# Patient Record
Sex: Female | Born: 1995 | Race: White | Hispanic: No | Marital: Single | Smoking: Current some day smoker
Health system: Southern US, Community
[De-identification: ages and names within clinical notes are randomized; demographics above are authoritative.]

## PROBLEM LIST (undated history)

## (undated) DIAGNOSIS — F909 Attention-deficit hyperactivity disorder, unspecified type: Secondary | ICD-10-CM

## (undated) DIAGNOSIS — K8020 Calculus of gallbladder without cholecystitis without obstruction: Secondary | ICD-10-CM

## (undated) HISTORY — DX: Attention-deficit hyperactivity disorder, unspecified type: F90.9

## (undated) HISTORY — DX: Calculus of gallbladder without cholecystitis without obstruction: K80.20

---
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
## 2013-01-15 ENCOUNTER — Ambulatory Visit (INDEPENDENT_AMBULATORY_CARE_PROVIDER_SITE_OTHER): Payer: Medicaid Other | Admitting: Advanced Practice Midwife

## 2013-01-15 VITALS — BP 122/69 | Wt 140.0 lb

## 2013-01-15 DIAGNOSIS — O360990 Maternal care for other rhesus isoimmunization, unspecified trimester, not applicable or unspecified: Secondary | ICD-10-CM

## 2013-01-15 DIAGNOSIS — O360121 Maternal care for anti-D [Rh] antibodies, second trimester, fetus 1: Secondary | ICD-10-CM

## 2013-01-15 DIAGNOSIS — Z3480 Encounter for supervision of other normal pregnancy, unspecified trimester: Secondary | ICD-10-CM

## 2013-01-15 NOTE — Progress Notes (Signed)
p=70 

## 2013-01-15 NOTE — Progress Notes (Signed)
Doing well.  Good fetal movement, denies vaginal bleeding, LOF, regular contractions.  Reviewed normal quad screen results.

## 2013-01-27 ENCOUNTER — Telehealth: Payer: Self-pay | Admitting: *Deleted

## 2013-02-12 ENCOUNTER — Ambulatory Visit (INDEPENDENT_AMBULATORY_CARE_PROVIDER_SITE_OTHER): Payer: Medicaid Other | Admitting: Advanced Practice Midwife

## 2013-02-12 VITALS — BP 120/79 | Wt 143.0 lb

## 2013-02-12 DIAGNOSIS — O360121 Maternal care for anti-D [Rh] antibodies, second trimester, fetus 1: Secondary | ICD-10-CM

## 2013-02-12 DIAGNOSIS — O360990 Maternal care for other rhesus isoimmunization, unspecified trimester, not applicable or unspecified: Secondary | ICD-10-CM

## 2013-02-12 DIAGNOSIS — Z3480 Encounter for supervision of other normal pregnancy, unspecified trimester: Secondary | ICD-10-CM

## 2013-02-12 DIAGNOSIS — Z3492 Encounter for supervision of normal pregnancy, unspecified, second trimester: Secondary | ICD-10-CM

## 2013-02-12 DIAGNOSIS — Z3482 Encounter for supervision of other normal pregnancy, second trimester: Secondary | ICD-10-CM

## 2013-02-12 DIAGNOSIS — Z23 Encounter for immunization: Secondary | ICD-10-CM

## 2013-02-12 MED ORDER — RHO D IMMUNE GLOBULIN 1500 UNIT/2ML IJ SOLN: 300.0000 ug | Freq: Once | INTRAMUSCULAR | Status: DC

## 2013-02-12 MED ORDER — RHO D IMMUNE GLOBULIN 1500 UNIT/2ML IJ SOLN
300.0000 ug | Freq: Once | INTRAMUSCULAR | Status: AC
  Administered 2013-02-12: 300 ug via INTRAMUSCULAR

## 2013-02-12 MED ORDER — DTAP-HEPATITIS B RECOMB-IPV IM SUSP: 0.5000 mL | Freq: Once | INTRAMUSCULAR | Status: DC

## 2013-02-12 MED ORDER — TETANUS-DIPHTH-ACELL PERTUSSIS 5-2.5-18.5 LF-MCG/0.5 IM SUSP: 0.5000 mL | Freq: Once | INTRAMUSCULAR | Status: DC

## 2013-02-12 NOTE — Progress Notes (Signed)
Doing well.  Good fetal movement, denies vaginal bleeding, LOF, regular contractions.  Pt reports round ligament pain when walking. Recommend pregnancy support belt, warm bath, increased PO fluids.  Glucose screen/rhophylac done today.

## 2013-02-12 NOTE — Addendum Note (Signed)
Addended by: Arne Cleveland on: 02/12/2013 12:18 PM   Modules accepted: Orders

## 2013-02-12 NOTE — Addendum Note (Signed)
Addended by: Granville Lewis on: 02/12/2013 12:17 PM   Modules accepted: Orders

## 2013-02-12 NOTE — Progress Notes (Signed)
p-74  28 week labs

## 2013-02-13 LAB — CBC
HCT: 33.1 % — ABNORMAL LOW (ref 36.0–46.0)
MCH: 32.5 pg (ref 26.0–34.0)
MCHC: 35.3 g/dL (ref 30.0–36.0)
MCV: 91.9 fL (ref 78.0–100.0)
RDW: 12.5 % (ref 11.5–15.5)

## 2013-02-18 ENCOUNTER — Other Ambulatory Visit: Payer: Self-pay

## 2013-02-25 ENCOUNTER — Telehealth: Payer: Self-pay | Admitting: *Deleted

## 2013-02-25 NOTE — Telephone Encounter (Signed)
Pt called stating that her boyfriend has bronchitis and she is now feeling bad.  Coughing with mucous, 99 temp and diarrhea.  Suggested that her appt tomorrow for ROB be rescheduled until Monday, advised increasing fluid intake, Mucinex for congestion and Tylenol for fever.  If she get worse she will call office or go to [facility].

## 2013-03-01 ENCOUNTER — Ambulatory Visit (INDEPENDENT_AMBULATORY_CARE_PROVIDER_SITE_OTHER): Payer: Medicaid Other | Admitting: Advanced Practice Midwife

## 2013-03-01 VITALS — BP 130/79 | Temp 97.8°F | Wt 144.0 lb

## 2013-03-01 DIAGNOSIS — Z3492 Encounter for supervision of normal pregnancy, unspecified, second trimester: Secondary | ICD-10-CM

## 2013-03-01 DIAGNOSIS — Z3482 Encounter for supervision of other normal pregnancy, second trimester: Secondary | ICD-10-CM

## 2013-03-01 DIAGNOSIS — O2613 Low weight gain in pregnancy, third trimester: Secondary | ICD-10-CM

## 2013-03-01 DIAGNOSIS — O2610 Low weight gain in pregnancy, unspecified trimester: Secondary | ICD-10-CM | POA: Insufficient documentation

## 2013-03-01 DIAGNOSIS — O9989 Other specified diseases and conditions complicating pregnancy, childbirth and the puerperium: Secondary | ICD-10-CM

## 2013-03-01 DIAGNOSIS — Z3480 Encounter for supervision of other normal pregnancy, unspecified trimester: Secondary | ICD-10-CM

## 2013-03-01 LAB — POCT URINALYSIS DIPSTICK
Bilirubin, UA: 1
Blood, UA: NEGATIVE
Glucose, UA: NEGATIVE
Nitrite, UA: NEGATIVE
Spec Grav, UA: 1.01

## 2013-03-01 NOTE — Progress Notes (Signed)
P - 67 

## 2013-03-01 NOTE — Progress Notes (Signed)
Doing well.  Good fetal movement, denies vaginal bleeding, LOF, regular contractions.  Reports 20lb weight loss in first trimester, ~20lb gain since then, so currently at prepregnancy weight.  Infant breech on previous ultrasound, vertex by [facility] today.  F/U ultrasound at 34 weeks for growth/position.  Pt coughing today, reports URI x5 days.  Fever a few days ago which resolved.  Taking Mucinex, Benadryl and symptoms improved.  Recommend saline nasal spray.  Also Sudafed Ok in pregnancy if needed.

## 2013-03-15 ENCOUNTER — Ambulatory Visit (INDEPENDENT_AMBULATORY_CARE_PROVIDER_SITE_OTHER): Payer: Medicaid Other | Admitting: Advanced Practice Midwife

## 2013-03-15 VITALS — BP 125/67 | Wt 145.0 lb

## 2013-03-15 DIAGNOSIS — O2613 Low weight gain in pregnancy, third trimester: Secondary | ICD-10-CM

## 2013-03-15 DIAGNOSIS — O9989 Other specified diseases and conditions complicating pregnancy, childbirth and the puerperium: Secondary | ICD-10-CM

## 2013-03-15 DIAGNOSIS — Z3480 Encounter for supervision of other normal pregnancy, unspecified trimester: Secondary | ICD-10-CM

## 2013-03-15 DIAGNOSIS — Z3492 Encounter for supervision of normal pregnancy, unspecified, second trimester: Secondary | ICD-10-CM

## 2013-03-15 DIAGNOSIS — Z3493 Encounter for supervision of normal pregnancy, unspecified, third trimester: Secondary | ICD-10-CM

## 2013-03-15 NOTE — Progress Notes (Signed)
Growth [place] scheduled per previous note. Flu vaccine NV.

## 2013-03-15 NOTE — Patient Instructions (Signed)
Levonorgestrel intrauterine device (IUD) What is this medicine? LEVONORGESTREL IUD (LEE voe nor jes trel) is a contraceptive (birth control) device. The device is placed inside the uterus by a healthcare professional. It is used to prevent pregnancy and can also be used to treat heavy bleeding that occurs during your period. Depending on the device, it can be used for 3 to 5 years. This medicine may be used for other purposes; ask your health care provider or pharmacist if you have questions. COMMON BRAND NAME(S): Mirena, Skyla What should I tell my health care provider before I take this medicine? They need to know if you have any of these conditions: -abnormal Pap smear -cancer of the breast, uterus, or cervix -diabetes -endometritis -genital or pelvic infection now or in the past -have more than one sexual partner or your partner has more than one partner -heart disease -history of an ectopic or tubal pregnancy -immune system problems -IUD in place -liver disease or tumor -problems with blood clots or take blood-thinners -use intravenous drugs -uterus of unusual shape -vaginal bleeding that has not been explained -an unusual or allergic reaction to levonorgestrel, other hormones, silicone, or polyethylene, medicines, foods, dyes, or preservatives -pregnant or trying to get pregnant -breast-feeding How should I use this medicine? This device is placed inside the uterus by a health care professional. Talk to your pediatrician regarding the use of this medicine in children. Special care may be needed. Overdosage: If you think you have taken too much of this medicine contact a poison control center or emergency room at once. NOTE: This medicine is only for you. Do not share this medicine with others. What if I miss a dose? This does not apply. What may interact with this medicine? Do not take this medicine with any of the following  medications: -amprenavir -bosentan -fosamprenavir This medicine may also interact with the following medications: -aprepitant -barbiturate medicines for inducing sleep or treating seizures -bexarotene -griseofulvin -medicines to treat seizures like carbamazepine, ethotoin, felbamate, oxcarbazepine, phenytoin, topiramate -modafinil -pioglitazone -rifabutin -rifampin -rifapentine -some medicines to treat HIV infection like atazanavir, indinavir, lopinavir, nelfinavir, tipranavir, ritonavir -St. John's wort -warfarin This list may not describe all possible interactions. Give your health care provider a list of all the medicines, herbs, non-prescription drugs, or dietary supplements you use. Also tell them if you smoke, drink alcohol, or use illegal drugs. Some items may interact with your medicine. What should I watch for while using this medicine? Visit your doctor or health care professional for regular check ups. See your doctor if you or your partner has sexual contact with others, becomes HIV positive, or gets a sexual transmitted disease. This product does not protect you against HIV infection (AIDS) or other sexually transmitted diseases. You can check the placement of the IUD yourself by reaching up to the top of your vagina with clean fingers to feel the threads. Do not pull on the threads. It is a good habit to check placement after each menstrual period. Call your doctor right away if you feel more of the IUD than just the threads or if you cannot feel the threads at all. The IUD may come out by itself. You may become pregnant if the device comes out. If you notice that the IUD has come out use a backup birth control method like condoms and call your health care provider. Using tampons will not change the position of the IUD and are okay to use during your period. What side effects may I   notice from receiving this medicine? Side effects that you should report to your doctor or  health care professional as soon as possible: -allergic reactions like skin rash, itching or hives, swelling of the face, lips, or tongue -fever, flu-like symptoms -genital sores -high blood pressure -no menstrual period for 6 weeks during use -pain, swelling, warmth in the leg -pelvic pain or tenderness -severe or sudden headache -signs of pregnancy -stomach cramping -sudden shortness of breath -trouble with balance, talking, or walking -unusual vaginal bleeding, discharge -yellowing of the eyes or skin Side effects that usually do not require medical attention (report to your doctor or health care professional if they continue or are bothersome): -acne -breast pain -change in sex drive or performance -changes in weight -cramping, dizziness, or faintness while the device is being inserted -headache -irregular menstrual bleeding within first 3 to 6 months of use -nausea This list may not describe all possible side effects. Call your doctor for medical advice about side effects. You may report side effects to FDA at 1-800-FDA-1088. Where should I keep my medicine? This does not apply. NOTE: This sheet is a summary. It may not cover all possible information. If you have questions about this medicine, talk to your doctor, pharmacist, or health care provider.  2014, [employer]vier/[employer] Standard. (2011-05-02 13:54:04)  Deberah Pelton Contractions Pregnancy is commonly associated with contractions of the uterus throughout the pregnancy. Towards the end of pregnancy (32 to 34 weeks), these contractions [facility] Willa Rough) can develop more often and may become more forceful. This is not true labor because these contractions do not result in opening (dilatation) and thinning of the cervix. They are sometimes difficult to tell apart from true labor because these contractions can be forceful and people have different pain tolerances. You should not feel embarrassed if you go to the hospital with false  labor. Sometimes, the only way to tell if you are in true labor is for your caregiver to follow the changes in the cervix. How to tell the difference between true and false labor:  False labor.  The contractions of false labor are usually shorter, irregular and not as hard as those of true labor.  They are often felt in the front of the lower abdomen and in the groin.  They may leave with walking around or changing positions while lying down.  They get weaker and are shorter lasting as time goes on.  These contractions are usually irregular.  They do not usually become progressively stronger, regular and closer together as with true labor.  True labor.  Contractions in true labor last 30 to 70 seconds, become very regular, usually become more intense, and increase in frequency.  They do not go away with walking.  The discomfort is usually felt in the top of the uterus and spreads to the lower abdomen and low back.  True labor can be determined by your caregiver with an exam. This will show that the cervix is dilating and getting thinner. If there are no prenatal problems or other health problems associated with the pregnancy, it is completely safe to be sent home with false labor and await the onset of true labor. HOME CARE INSTRUCTIONS   Keep up with your usual exercises and instructions.  Take medications as directed.  Keep your regular prenatal appointment.  Eat and drink lightly if you think you are going into labor.  If BH contractions are making you uncomfortable:  Change your activity position from lying down or resting to walking/walking  to resting.  Sit and rest in a tub of warm water.  Drink 2 to 3 glasses of water. Dehydration may cause B-H contractions.  Do slow and deep breathing several times an hour. SEEK IMMEDIATE MEDICAL CARE IF:   Your contractions continue to become stronger, more regular, and closer together.  You have a gushing, burst or leaking  of fluid from the vagina.  An oral temperature above 102 F (38.9 C) develops.  You have passage of blood-tinged mucus.  You develop vaginal bleeding.  You develop continuous belly (abdominal) pain.  You have low back pain that you never had before.  You feel the baby's head pushing down causing pelvic pressure.  The baby is not moving as much as it used to. Document Released: 04/01/2005 Document Revised: 06/24/2011 Document Reviewed: 09/23/2008 [facility] Patient Information 2014 [place], [place].

## 2013-03-15 NOTE — Progress Notes (Signed)
p-79 

## 2013-03-22 ENCOUNTER — Ambulatory Visit (HOSPITAL_COMMUNITY)
Admission: RE | Admit: 2013-03-22 | Discharge: 2013-03-22 | Disposition: A | Discharge status: Home / Self Care | Payer: Medicaid Other | Source: Ambulatory Visit | Attending: Advanced Practice Midwife | Admitting: Advanced Practice Midwife

## 2013-03-22 DIAGNOSIS — Z3689 Encounter for other specified antenatal screening: Secondary | ICD-10-CM | POA: Insufficient documentation

## 2013-03-22 DIAGNOSIS — O2613 Low weight gain in pregnancy, third trimester: Secondary | ICD-10-CM

## 2013-03-22 DIAGNOSIS — Z3493 Encounter for supervision of normal pregnancy, unspecified, third trimester: Secondary | ICD-10-CM

## 2013-03-29 ENCOUNTER — Encounter: Payer: Self-pay | Admitting: Advanced Practice Midwife

## 2013-03-29 ENCOUNTER — Ambulatory Visit (INDEPENDENT_AMBULATORY_CARE_PROVIDER_SITE_OTHER): Payer: Medicaid Other | Admitting: Advanced Practice Midwife

## 2013-03-29 VITALS — BP 134/93 | Wt 149.0 lb

## 2013-03-29 DIAGNOSIS — Z23 Encounter for immunization: Secondary | ICD-10-CM

## 2013-03-29 DIAGNOSIS — Z3480 Encounter for supervision of other normal pregnancy, unspecified trimester: Secondary | ICD-10-CM

## 2013-03-29 MED ORDER — INFLUENZA VAC SPLIT QUAD 0.5 ML IM SUSP: 0.5000 mL | Freq: Once | INTRAMUSCULAR | Status: DC

## 2013-03-29 NOTE — Progress Notes (Signed)
[place] results reviewed. Growth within normal limits. Good fetal movement. Plan cultures next visit.

## 2013-03-29 NOTE — Progress Notes (Signed)
P = 87 

## 2013-03-29 NOTE — Patient Instructions (Signed)
Third Trimester of Pregnancy  The third trimester is from week 29 through week 42, months 7 through 9. The third trimester is a time when the fetus is growing rapidly. At the end of the ninth month, the fetus is about 20 inches in length and weighs 6 10 pounds.   BODY CHANGES  Your body goes through many changes during pregnancy. The changes vary from woman to woman.    Your weight will continue to increase. You can expect to gain 25 35 pounds (11 16 kg) by the end of the pregnancy.   You may begin to get stretch marks on your hips, abdomen, and breasts.   You may urinate more often because the fetus is moving lower into your pelvis and pressing on your bladder.   You may develop or continue to have heartburn as a result of your pregnancy.   You may develop constipation because certain hormones are causing the muscles that push waste through your intestines to slow down.   You may develop hemorrhoids or swollen, bulging veins (varicose veins).   You may have pelvic pain because of the weight gain and pregnancy hormones relaxing your joints between the bones in your pelvis. Back aches may result from over exertion of the muscles supporting your posture.   Your breasts will continue to grow and be tender. A yellow discharge may leak from your breasts called colostrum.   Your belly button may stick out.   You may feel short of breath because of your expanding uterus.   You may notice the fetus "dropping," or moving lower in your abdomen.   You may have a bloody mucus discharge. This usually occurs a few days to a week before labor begins.   Your cervix becomes thin and soft (effaced) near your due date.  WHAT TO EXPECT AT YOUR PRENATAL EXAMS   You will have prenatal exams every 2 weeks until week 36. Then, you will have weekly prenatal exams. During a routine prenatal visit:   You will be weighed to make sure you and the fetus are growing normally.   Your blood pressure is taken.   Your abdomen will be  measured to track your baby's growth.   The fetal heartbeat will be listened to.   Any test results from the previous visit will be discussed.   You may have a cervical check near your due date to see if you have effaced.  At around 36 weeks, your caregiver will check your cervix. At the same time, your caregiver will also perform a test on the secretions of the vaginal tissue. This test is to determine if a type of bacteria, Group B streptococcus, is present. Your caregiver will explain this further.  Your caregiver may ask you:   What your birth plan is.   How you are feeling.   If you are feeling the baby move.   If you have had any abnormal symptoms, such as leaking fluid, bleeding, severe headaches, or abdominal cramping.   If you have any questions.  Other tests or screenings that may be performed during your third trimester include:   Blood tests that check for low iron levels (anemia).   Fetal testing to check the health, activity level, and growth of the fetus. Testing is done if you have certain medical conditions or if there are problems during the pregnancy.  FALSE LABOR  You may feel small, irregular contractions that eventually go away. These are called Braxton Hicks contractions, or   false labor. Contractions may last for hours, days, or even weeks before true labor sets in. If contractions come at regular intervals, intensify, or become painful, it is best to be seen by your caregiver.   SIGNS OF LABOR    Menstrual-like cramps.   Contractions that are 5 minutes apart or less.   Contractions that start on the top of the uterus and spread down to the lower abdomen and back.   A sense of increased pelvic pressure or back pain.   A watery or bloody mucus discharge that comes from the vagina.  If you have any of these signs before the 37th week of pregnancy, call your caregiver right away. You need to go to the hospital to get checked immediately.  HOME CARE INSTRUCTIONS    Avoid all  smoking, herbs, alcohol, and unprescribed drugs. These chemicals affect the formation and growth of the baby.   Follow your caregiver's instructions regarding medicine use. There are medicines that are either safe or unsafe to take during pregnancy.   Exercise only as directed by your caregiver. Experiencing uterine cramps is a good sign to stop exercising.   Continue to eat regular, healthy meals.   Wear a good support bra for breast tenderness.   Do not use hot tubs, steam rooms, or saunas.   Wear your seat belt at all times when driving.   Avoid raw meat, uncooked cheese, cat litter boxes, and soil used by cats. These carry germs that can cause birth defects in the baby.   Take your prenatal vitamins.   Try taking a stool softener (if your caregiver approves) if you develop constipation. Eat more high-fiber foods, such as fresh vegetables or fruit and whole grains. Drink plenty of fluids to keep your urine clear or pale yellow.   Take warm sitz baths to soothe any pain or discomfort caused by hemorrhoids. Use hemorrhoid cream if your caregiver approves.   If you develop varicose veins, wear support hose. Elevate your feet for 15 minutes, 3 4 times a day. Limit salt in your diet.   Avoid heavy lifting, wear low heal shoes, and practice good posture.   Rest a lot with your legs elevated if you have leg cramps or low back pain.   Visit your dentist if you have not gone during your pregnancy. Use a soft toothbrush to brush your teeth and be gentle when you floss.   A sexual relationship may be continued unless your caregiver directs you otherwise.   Do not travel far distances unless it is absolutely necessary and only with the approval of your caregiver.   Take prenatal classes to understand, practice, and ask questions about the labor and delivery.   Make a trial run to the hospital.   Pack your hospital bag.   Prepare the baby's nursery.   Continue to go to all your prenatal visits as directed  by your caregiver.  SEEK MEDICAL CARE IF:   You are unsure if you are in labor or if your water has broken.   You have dizziness.   You have mild pelvic cramps, pelvic pressure, or nagging pain in your abdominal area.   You have persistent nausea, vomiting, or diarrhea.   You have a bad smelling vaginal discharge.   You have pain with urination.  SEEK IMMEDIATE MEDICAL CARE IF:    You have a fever.   You are leaking fluid from your vagina.   You have spotting or bleeding from your vagina.     You have severe abdominal cramping or pain.   You have rapid weight loss or gain.   You have shortness of breath with chest pain.   You notice sudden or extreme swelling of your face, hands, ankles, feet, or legs.   You have not felt your baby move in over an hour.   You have severe headaches that do not go away with medicine.   You have vision changes.  Document Released: 03/26/2001 Document Revised: 12/02/2012 Document Reviewed: 06/02/2012  [employer] Patient Information 2014 [employer].

## 2013-04-05 ENCOUNTER — Ambulatory Visit (INDEPENDENT_AMBULATORY_CARE_PROVIDER_SITE_OTHER): Payer: Medicaid Other | Admitting: Advanced Practice Midwife

## 2013-04-05 ENCOUNTER — Encounter: Payer: Self-pay | Admitting: Advanced Practice Midwife

## 2013-04-05 VITALS — BP 141/85 | Wt 146.0 lb

## 2013-04-05 DIAGNOSIS — Z3493 Encounter for supervision of normal pregnancy, unspecified, third trimester: Secondary | ICD-10-CM

## 2013-04-05 DIAGNOSIS — Z3483 Encounter for supervision of other normal pregnancy, third trimester: Secondary | ICD-10-CM

## 2013-04-05 DIAGNOSIS — Z3480 Encounter for supervision of other normal pregnancy, unspecified trimester: Secondary | ICD-10-CM

## 2013-04-05 DIAGNOSIS — O9989 Other specified diseases and conditions complicating pregnancy, childbirth and the puerperium: Secondary | ICD-10-CM

## 2013-04-05 DIAGNOSIS — O2613 Low weight gain in pregnancy, third trimester: Secondary | ICD-10-CM

## 2013-04-05 DIAGNOSIS — R030 Elevated blood-pressure reading, without diagnosis of hypertension: Secondary | ICD-10-CM

## 2013-04-05 LAB — OB RESULTS CONSOLE GC/CHLAMYDIA
CHLAMYDIA, DNA PROBE: NEGATIVE
GC PROBE AMP, GENITAL: NEGATIVE

## 2013-04-05 LAB — OB RESULTS CONSOLE GBS: STREP GROUP B AG: NEGATIVE

## 2013-04-05 NOTE — Progress Notes (Signed)
p-85 

## 2013-04-05 NOTE — Progress Notes (Signed)
Doing well.  Good fetal movement, denies vaginal bleeding, LOF, regular contractions.  Denies h/a, epigastric pain, visual disturbances.  Retake BP 130s/90s.  CBC, CMP, P/C ratio today.  Appt scheduled in 1 week.

## 2013-04-06 LAB — GC/CHLAMYDIA PROBE AMP
CT Probe RNA: NEGATIVE
GC Probe RNA: NEGATIVE

## 2013-04-06 LAB — COMPREHENSIVE METABOLIC PANEL
ALT: <8 U/L (ref 0–35)
Albumin: 3.6 g/dL (ref 3.5–5.2)
Alkaline Phosphatase: 151 U/L — ABNORMAL HIGH (ref 39–117)
Glucose, Bld: 96 mg/dL (ref 70–99)
Potassium: 3.7 mEq/L (ref 3.5–5.3)
Sodium: 137 mEq/L (ref 135–145)
Total Bilirubin: 0.4 mg/dL (ref 0.3–1.2)
Total Protein: 6.1 g/dL (ref 6.0–8.3)

## 2013-04-06 LAB — CBC
Hemoglobin: 12.7 g/dL (ref 12.0–15.0)
MCHC: 35 g/dL (ref 30.0–36.0)
Platelets: 229 10*3/uL (ref 150–400)
RBC: 4.09 MIL/uL (ref 3.87–5.11)

## 2013-04-06 LAB — PROTEIN / CREATININE RATIO, URINE: Total Protein, Urine: 12 mg/dL

## 2013-04-07 LAB — CULTURE, BETA STREP (GROUP B ONLY)

## 2013-04-12 ENCOUNTER — Encounter: Payer: Self-pay | Admitting: Advanced Practice Midwife

## 2013-04-12 ENCOUNTER — Ambulatory Visit (INDEPENDENT_AMBULATORY_CARE_PROVIDER_SITE_OTHER): Payer: Medicaid Other | Admitting: Advanced Practice Midwife

## 2013-04-12 VITALS — BP 122/80 | Wt 150.0 lb

## 2013-04-12 DIAGNOSIS — O360990 Maternal care for other rhesus isoimmunization, unspecified trimester, not applicable or unspecified: Secondary | ICD-10-CM

## 2013-04-12 DIAGNOSIS — Z3480 Encounter for supervision of other normal pregnancy, unspecified trimester: Secondary | ICD-10-CM

## 2013-04-12 DIAGNOSIS — Z6791 Unspecified blood type, Rh negative: Secondary | ICD-10-CM

## 2013-04-12 NOTE — Progress Notes (Signed)
p-73 

## 2013-04-12 NOTE — Progress Notes (Signed)
Doing well.  Good fetal movement, denies vaginal bleeding, LOF, regular contractions.   Some occasional uncomfortable contractions.  Cervical exam at pt request, cervix 1.5/50/-3, soft, midposition, vertex.  Reviewed signs of labor.

## 2013-04-15 NOTE — L&D Delivery Note (Signed)
Delivery Note At 5:08 AM a viable female was delivered via Vaginal, Spontaneous Delivery (Presentation: Right Occiput Anterior).  APGAR: 8, 9; weight .   Placenta status: Intact, Spontaneous.  Cord: 3 vessels with the following complications: None.    Anesthesia: Epidural  Episiotomy: None Lacerations: Labial Suture Repair: 4.0 monocryl on SH Est. Blood Loss (mL): 250  Mom to postpartum.  Baby to Couplet care / Skin to Skin. NSVD over itnact perinuem. Active managmetn of third stage of labor with pit and traction delivered intact placenta 3v cord. Labial tear repaired with 4.0 monocryl on SH. Hemostatic, EBL 250. Counts correct.  Jolyn LentODOM, Lorre Opdahl RYAN 04/27/2013, 5:25 AM

## 2013-04-18 ENCOUNTER — Inpatient Hospital Stay (HOSPITAL_COMMUNITY)
Admission: AD | Admit: 2013-04-18 | Discharge: 2013-04-18 | Disposition: A | Discharge status: Home / Self Care | Payer: Medicaid Other | Source: Ambulatory Visit | Attending: Obstetrics & Gynecology | Admitting: Obstetrics & Gynecology

## 2013-04-18 ENCOUNTER — Encounter (HOSPITAL_COMMUNITY): Payer: Self-pay

## 2013-04-18 DIAGNOSIS — O479 False labor, unspecified: Secondary | ICD-10-CM | POA: Insufficient documentation

## 2013-04-18 NOTE — Discharge Instructions (Signed)
Third Trimester of Pregnancy  The third trimester is from week 29 through week 42, months 7 through 9. The third trimester is a time when the fetus is growing rapidly. At the end of the ninth month, the fetus is about 20 inches in length and weighs 6 10 pounds.   BODY CHANGES  Your body goes through many changes during pregnancy. The changes vary from woman to woman.    Your weight will continue to increase. You can expect to gain 25 35 pounds (11 16 kg) by the end of the pregnancy.   You may begin to get stretch marks on your hips, abdomen, and breasts.   You may urinate more often because the fetus is moving lower into your pelvis and pressing on your bladder.   You may develop or continue to have heartburn as a result of your pregnancy.   You may develop constipation because certain hormones are causing the muscles that push waste through your intestines to slow down.   You may develop hemorrhoids or swollen, bulging veins (varicose veins).   You may have pelvic pain because of the weight gain and pregnancy hormones relaxing your joints between the bones in your pelvis. Back aches may result from over exertion of the muscles supporting your posture.   Your breasts will continue to grow and be tender. A yellow discharge may leak from your breasts called colostrum.   Your belly button may stick out.   You may feel short of breath because of your expanding uterus.   You may notice the fetus "dropping," or moving lower in your abdomen.   You may have a bloody mucus discharge. This usually occurs a few days to a week before labor begins.   Your cervix becomes thin and soft (effaced) near your due date.  WHAT TO EXPECT AT YOUR PRENATAL EXAMS   You will have prenatal exams every 2 weeks until week 36. Then, you will have weekly prenatal exams. During a routine prenatal visit:   You will be weighed to make sure you and the fetus are growing normally.   Your blood pressure is taken.   Your abdomen will be  measured to track your baby's growth.   The fetal heartbeat will be listened to.   Any test results from the previous visit will be discussed.   You may have a cervical check near your due date to see if you have effaced.  At around 36 weeks, your caregiver will check your cervix. At the same time, your caregiver will also perform a test on the secretions of the vaginal tissue. This test is to determine if a type of bacteria, Group B streptococcus, is present. Your caregiver will explain this further.  Your caregiver may ask you:   What your birth plan is.   How you are feeling.   If you are feeling the baby move.   If you have had any abnormal symptoms, such as leaking fluid, bleeding, severe headaches, or abdominal cramping.   If you have any questions.  Other tests or screenings that may be performed during your third trimester include:   Blood tests that check for low iron levels (anemia).   Fetal testing to check the health, activity level, and growth of the fetus. Testing is done if you have certain medical conditions or if there are problems during the pregnancy.  FALSE LABOR  You may feel small, irregular contractions that eventually go away. These are called Braxton Hicks contractions, or   false labor. Contractions may last for hours, days, or even weeks before true labor sets in. If contractions come at regular intervals, intensify, or become painful, it is best to be seen by your caregiver.   SIGNS OF LABOR    Menstrual-like cramps.   Contractions that are 5 minutes apart or less.   Contractions that start on the top of the uterus and spread down to the lower abdomen and back.   A sense of increased pelvic pressure or back pain.   A watery or bloody mucus discharge that comes from the vagina.  If you have any of these signs before the 37th week of pregnancy, call your caregiver right away. You need to go to the hospital to get checked immediately.  HOME CARE INSTRUCTIONS    Avoid all  smoking, herbs, alcohol, and unprescribed drugs. These chemicals affect the formation and growth of the baby.   Follow your caregiver's instructions regarding medicine use. There are medicines that are either safe or unsafe to take during pregnancy.   Exercise only as directed by your caregiver. Experiencing uterine cramps is a good sign to stop exercising.   Continue to eat regular, healthy meals.   Wear a good support bra for breast tenderness.   Do not use hot tubs, steam rooms, or saunas.   Wear your seat belt at all times when driving.   Avoid raw meat, uncooked cheese, cat litter boxes, and soil used by cats. These carry germs that can cause birth defects in the baby.   Take your prenatal vitamins.   Try taking a stool softener (if your caregiver approves) if you develop constipation. Eat more high-fiber foods, such as fresh vegetables or fruit and whole grains. Drink plenty of fluids to keep your urine clear or pale yellow.   Take warm sitz baths to soothe any pain or discomfort caused by hemorrhoids. Use hemorrhoid cream if your caregiver approves.   If you develop varicose veins, wear support hose. Elevate your feet for 15 minutes, 3 4 times a day. Limit salt in your diet.   Avoid heavy lifting, wear low heal shoes, and practice good posture.   Rest a lot with your legs elevated if you have leg cramps or low back pain.   Visit your dentist if you have not gone during your pregnancy. Use a soft toothbrush to brush your teeth and be gentle when you floss.   A sexual relationship may be continued unless your caregiver directs you otherwise.   Do not travel far distances unless it is absolutely necessary and only with the approval of your caregiver.   Take prenatal classes to understand, practice, and ask questions about the labor and delivery.   Make a trial run to the hospital.   Pack your hospital bag.   Prepare the baby's nursery.   Continue to go to all your prenatal visits as directed  by your caregiver.  SEEK MEDICAL CARE IF:   You are unsure if you are in labor or if your water has broken.   You have dizziness.   You have mild pelvic cramps, pelvic pressure, or nagging pain in your abdominal area.   You have persistent nausea, vomiting, or diarrhea.   You have a bad smelling vaginal discharge.   You have pain with urination.  SEEK IMMEDIATE MEDICAL CARE IF:    You have a fever.   You are leaking fluid from your vagina.   You have spotting or bleeding from your vagina.     You have severe abdominal cramping or pain.   You have rapid weight loss or gain.   You have shortness of breath with chest pain.   You notice sudden or extreme swelling of your face, hands, ankles, feet, or legs.   You have not felt your baby move in over an hour.   You have severe headaches that do not go away with medicine.   You have vision changes.  Document Released: 03/26/2001 Document Revised: 12/02/2012 Document Reviewed: 06/02/2012  [employer] Patient Information 2014 [employer].

## 2013-04-18 NOTE — MAU Note (Signed)
Pt presents complaining of contractions every 4-5 minutes apart. Denies vaginal bleeding or loss of fluid.

## 2013-04-21 ENCOUNTER — Encounter: Payer: Self-pay | Admitting: Obstetrics & Gynecology

## 2013-04-21 ENCOUNTER — Encounter (HOSPITAL_COMMUNITY): Payer: Self-pay | Admitting: *Deleted

## 2013-04-21 ENCOUNTER — Inpatient Hospital Stay (HOSPITAL_COMMUNITY)
Admission: AD | Admit: 2013-04-21 | Discharge: 2013-04-21 | DRG: 780 | Disposition: A | Discharge status: Home / Self Care | Payer: Medicaid Other | Source: Ambulatory Visit | Attending: Obstetrics and Gynecology | Admitting: Obstetrics and Gynecology

## 2013-04-21 ENCOUNTER — Ambulatory Visit (INDEPENDENT_AMBULATORY_CARE_PROVIDER_SITE_OTHER): Payer: Medicaid Other | Admitting: Obstetrics & Gynecology

## 2013-04-21 VITALS — BP 132/93 | Wt 150.0 lb

## 2013-04-21 DIAGNOSIS — Z6791 Unspecified blood type, Rh negative: Secondary | ICD-10-CM

## 2013-04-21 DIAGNOSIS — O479 False labor, unspecified: Secondary | ICD-10-CM

## 2013-04-21 DIAGNOSIS — Z3492 Encounter for supervision of normal pregnancy, unspecified, second trimester: Secondary | ICD-10-CM

## 2013-04-21 DIAGNOSIS — O99330 Smoking (tobacco) complicating pregnancy, unspecified trimester: Secondary | ICD-10-CM

## 2013-04-21 DIAGNOSIS — IMO0001 Reserved for inherently not codable concepts without codable children: Secondary | ICD-10-CM

## 2013-04-21 DIAGNOSIS — O26899 Other specified pregnancy related conditions, unspecified trimester: Secondary | ICD-10-CM

## 2013-04-21 DIAGNOSIS — F17200 Nicotine dependence, unspecified, uncomplicated: Secondary | ICD-10-CM

## 2013-04-21 DIAGNOSIS — Z3480 Encounter for supervision of other normal pregnancy, unspecified trimester: Secondary | ICD-10-CM

## 2013-04-21 LAB — CBC
HCT: 33.5 % — ABNORMAL LOW (ref 36.0–46.0)
Hemoglobin: 11.9 g/dL — ABNORMAL LOW (ref 12.0–15.0)
MCH: 29.9 pg (ref 26.0–34.0)
MCHC: 35.5 g/dL (ref 30.0–36.0)
MCV: 84.2 fL (ref 78.0–100.0)
Platelets: 192 K/uL (ref 150–400)
RBC: 3.98 MIL/uL (ref 3.87–5.11)
RDW: 12.3 % (ref 11.5–15.5)
WBC: 10.8 K/uL — ABNORMAL HIGH (ref 4.0–10.5)

## 2013-04-21 LAB — RPR: RPR Ser Ql: NONREACTIVE

## 2013-04-21 MED ORDER — CITRIC ACID-SODIUM CITRATE 334-500 MG/5ML PO SOLN: 30.0000 mL | ORAL | Status: DC | PRN

## 2013-04-21 MED ORDER — ONDANSETRON HCL 4 MG/2ML IJ SOLN: 4.0000 mg | Freq: Four times a day (QID) | INTRAMUSCULAR | Status: DC | PRN

## 2013-04-21 MED ORDER — PHENYLEPHRINE 40 MCG/ML (10ML) SYRINGE FOR IV PUSH (FOR BLOOD PRESSURE SUPPORT): 80.0000 ug | PREFILLED_SYRINGE | INTRAVENOUS | Status: DC | PRN

## 2013-04-21 MED ORDER — LACTATED RINGERS IV SOLN: 500.0000 mL | Freq: Once | INTRAVENOUS | Status: DC

## 2013-04-21 MED ORDER — IBUPROFEN 600 MG PO TABS: 600.0000 mg | ORAL_TABLET | Freq: Four times a day (QID) | ORAL | Status: DC | PRN

## 2013-04-21 MED ORDER — OXYTOCIN BOLUS FROM INFUSION: 500.0000 mL | INTRAVENOUS | Status: DC

## 2013-04-21 MED ORDER — ACETAMINOPHEN 325 MG PO TABS: 650.0000 mg | ORAL_TABLET | ORAL | Status: DC | PRN

## 2013-04-21 MED ORDER — FENTANYL 2.5 MCG/ML BUPIVACAINE 1/10 % EPIDURAL INFUSION (WH - ANES): 14.0000 mL/h | INTRAMUSCULAR | Status: DC | PRN

## 2013-04-21 MED ORDER — LIDOCAINE HCL (PF) 1 % IJ SOLN: 30.0000 mL | INTRAMUSCULAR | Status: DC | PRN

## 2013-04-21 MED ORDER — EPHEDRINE 5 MG/ML INJ: 10.0000 mg | INTRAVENOUS | Status: DC | PRN

## 2013-04-21 MED ORDER — ZOLPIDEM TARTRATE 5 MG PO TABS: 5.0000 mg | ORAL_TABLET | Freq: Every evening | ORAL | Status: DC | PRN

## 2013-04-21 MED ORDER — DIPHENHYDRAMINE HCL 50 MG/ML IJ SOLN: 12.5000 mg | INTRAMUSCULAR | Status: DC | PRN

## 2013-04-21 MED ORDER — OXYCODONE-ACETAMINOPHEN 5-325 MG PO TABS: 1.0000 | ORAL_TABLET | ORAL | Status: DC | PRN

## 2013-04-21 MED ORDER — LACTATED RINGERS IV SOLN: 500.0000 mL | INTRAVENOUS | Status: DC | PRN

## 2013-04-21 MED ORDER — OXYTOCIN 40 UNITS IN LACTATED RINGERS INFUSION - SIMPLE MED: 62.5000 mL/h | INTRAVENOUS | Status: DC

## 2013-04-21 MED ORDER — LACTATED RINGERS IV SOLN
INTRAVENOUS | Status: DC
  Administered 2013-04-21: 15:00:00 via INTRAVENOUS

## 2013-04-21 NOTE — Progress Notes (Signed)
[id number] 

## 2013-04-21 NOTE — MAU Note (Signed)
Patient states she was seen in the office today and was 4 cm and sent to MAU for evaluation. States she is having frequent contractions every 3-5 minutes. Has bloody show from exam, no leaking. Reports good fetal movement.

## 2013-04-21 NOTE — Discharge Summary (Signed)
Obstetric Discharge Summary Reason for Admission: observation/evaluation Pt was admitted from OV for cervical dilation to 4 cm and contractions every 5-10 minutes for further evaluation for possible onset of labor.  However, pt was checked 4 hours later and was 4-5 cm dilated, and checked again 4 hours after this still at 4 cm.  Her contractions remained very similar at every 5-10 minutes, not increasing in amplitude.  The situation was explained to the pt and her family that this could continue for several days and at this point with failure for actually true onset of labor, that return to home and follow up appointments with her OB was a better option.  Pt and her family understood this and were agreeable to the plan.   Hemoglobin  Date Value Range Status  04/21/2013 11.9* 12.0 - 15.0 g/dL Final     HCT  Date Value Range Status  04/21/2013 33.5* 36.0 - 46.0 % Final    Physical Exam:  General: alert, cooperative and appears stated age Heart: RRR, no murmurs appreciated   Dilation: 3.5 Effacement (%): 70 Cervical Position: Middle Station: -2 Presentation: Vertex Exam by:: L.Mears,Rn   Discharge Diagnoses: Failure of onset of labor  Discharge Information: Date: 04/21/2013 Activity: unrestricted Diet: routine Medications: None Condition: stable Instructions: refer to practice specific booklet Discharge to: home   Gildardo CrankerHess, Bryan 04/21/2013, 10:01 PM  I have participated in the care of this patient and I agree with the above. Cam HaiSHAW, Arafat Cocuzza 10:26 PM 04/21/2013

## 2013-04-21 NOTE — Progress Notes (Signed)
Pt up to ambulate in hallway.

## 2013-04-21 NOTE — Progress Notes (Signed)
Routine visit.  Good FM. Some contractions. Labor precautions reviewed. Membranes swept. DTRs-1+ and equal, No edema. She denies HA, visual changes, or RUQ pain.

## 2013-04-21 NOTE — Discharge Instructions (Signed)
Augmentation of Labor Augmentation of labor is when labor is stimulated with oxytocin medication. Oxytocin stimulates and strengthens uterine contractions. The medicine can be given if the uterine contractions slow down, stop or delay the progress of labor and delivery of the baby. It may be given through a vein (intravenously), in small or high doses using an electronic monitor. The monitor controls the dose every 20 minutes until the uterine contractions last long enough and are strong enough to help the labor and delivery progress without complications to the mother and baby.  Another way to augment labor is to break the bag of water (amniotic sac) that the baby is in. The mother and baby's conditions, the baby's size and position, and the size of the birth canal should be evaluated before beginning augmentation of the labor. LABOR SHOULD NOT BE AUGMENTED IF:  The baby is too big for the birth canal. This can be confirmed by ultrasound.  The umbilical cord drops in front of the baby's head or breech part (prolapse cord).  A previous Cesarean section was done with a vertical incision or you do not know what kind of incision was made in the uterus.  High dose oxytocin should not be used if there was a Cesarean section of any kind.  There was surgery on or into the uterus.  Herpes is present.  Cervical cancer is present.  The baby is lying sideways.  The pelvis is deformed.  You are pregnant with more than twins. AUGMENTATION CAN BE DONE WITH CLOSE MONITORING OF THE BABY AND MOTHER IF:  You have had 1 Cesarean section (as long as the uterine incision was transverse).  You have a breech baby, buttocks is first instead of the head.  You have twins.  You have heart, lung, or kidney disease.  You have too much fluid in the amniotic sac.  The baby's head is not engaged in the pelvis.  You have very high blood pressure. RISKS AND COMPLICATIONS   Over stimulation of the uterine  contractions (continuous, prolonged, very strong contractions) causing fetal distress.  Increase infection to the mother and baby.  Water intoxication of the mother if the proper intravenous solution is not used.  Uterine tearing (rupture).  Abruption of the placenta.  There is an increase in Cesarean section, forceps, and vacuum deliveries. THE BENEFITS OF AUGMENTATION OF LABOR:  Improves the contractions for a shorter labor and delivery.  Less use of medications and worry to the mother. When augmentation of labor is necessary the benefits should outweigh the risk. Document Released: 09/24/2006 Document Revised: 03/18/2012 Document Reviewed: 10/29/2012 [facility] Patient Information 2014 [place], [place].

## 2013-04-21 NOTE — Progress Notes (Signed)
NST reactive.

## 2013-04-21 NOTE — H&P (Signed)
Michele Perkins is a 18 y.o. female presenting for labor . Office Note: Patient states she was seen in the office today and was 4 cm and sent to MAU for evaluation. States she is having frequent contractions every 3-5 minutes. Has bloody show from exam, no leaking. Reports good fetal movement.        Maternal Medical History:  Reason for admission: Nausea.    OB History   Grav Para Term Preterm Abortions TAB SAB Ect Mult Living   1              Past Medical History  Diagnosis Date  . Gallbladder colic    History reviewed. No pertinent past surgical history. Family History: family history includes Heart attack in her maternal grandfather and maternal grandfather. Social History:  reports that she has been smoking Cigarettes.  She has a 1.75 pack-year smoking history. She has never used smokeless tobacco. She reports that she uses illicit drugs. She reports that she does not drink alcohol.   Prenatal Transfer Tool  Maternal Diabetes: No Genetic Screening: Normal Maternal Ultrasounds/Referrals: Normal Fetal Ultrasounds or other Referrals:  None Maternal Substance Abuse:  Yes:  Type: Smoker Significant Maternal Medications:  None Significant Maternal Lab Results:  None Other Comments:  None  Review of Systems  Constitutional: Negative for fever, chills and malaise/fatigue.  Gastrointestinal: Positive for abdominal pain. Negative for nausea, vomiting, diarrhea and constipation.  Genitourinary: Negative for dysuria.  Neurological: Negative for dizziness and headaches.    Dilation: 5 Effacement (%): 80 Station: -2;-1 Exam by:: CRoxan Hockey RN Blood pressure 137/79, pulse 58, temperature 98 F (36.7 C), temperature source Oral, resp. rate 20, height 5\' 2"  (1.575 m), weight 68.04 kg (150 lb), last menstrual period 08/20/2012, SpO2 100.00%. Maternal Exam:  Uterine Assessment: Contraction strength is moderate.  Contraction frequency is irregular.   Abdomen: Fundal height is  38.   Estimated fetal weight is 7.2.   Fetal presentation: vertex  Introitus: Normal vulva. Vagina is positive for vaginal discharge (bloody show).  Ferning test: not done.  Nitrazine test: not done. Amniotic fluid character: not assessed.  Pelvis: adequate for delivery.   Cervix: Cervix evaluated by digital exam.     Fetal Exam Fetal Monitor Review: Mode: ultrasound.   Baseline rate: 140.  Variability: moderate (6-25 bpm).   Pattern: accelerations present and no decelerations.    Fetal State Assessment: Category I - tracings are normal.     Physical Exam  Constitutional: She is oriented to person, place, and time. She appears well-developed and well-nourished. No distress.  HENT:  Head: Normocephalic.  Cardiovascular: Normal rate.   Respiratory: Effort normal.  GI: Soft. She exhibits no distension. There is no tenderness. There is no rebound and no guarding.  Genitourinary: Uterus normal. Vaginal discharge (bloody show) found.  Musculoskeletal: Normal range of motion.  Neurological: She is alert and oriented to person, place, and time.  Skin: Skin is warm and dry.  Psychiatric: She has a normal mood and affect.    Dilation: 5 Effacement (%): 80 Cervical Position: Middle Station: -2;-1 Presentation: Vertex Exam by:: Elonda Husky RN  Prenatal labs: ABO, Rh: A/NEG/-- (08/04 1231) Antibody: NEG (10/31 1146) Rubella: 1.08 (08/04 1231) RPR: NON REAC (08/04 1231)  HBsAg: NEGATIVE (08/04 1231)  HIV: NON REACTIVE (10/31 1032)  GBS:   Neg  Assessment/Plan: A:  SIUP at [redacted]w[redacted]d       Active Labor  P:  Admit to [facility]  Routine orders      Desires epidural later   [facility] IncWILLIAMS,Adarryl Goldammer 04/21/2013, 4:00 PM

## 2013-04-22 NOTE — Discharge Summary (Signed)
`````  Attestation of Attending Supervision of Advanced Practitioner: Evaluation and management procedures were performed by the PA/NP/CNM/OB Fellow under my supervision/collaboration. Chart reviewed and agree with management and plan.  Tj Kitchings V 04/22/2013 9:51 AM

## 2013-04-26 ENCOUNTER — Inpatient Hospital Stay (HOSPITAL_COMMUNITY): Payer: Medicaid Other | Admitting: Anesthesiology

## 2013-04-26 ENCOUNTER — Encounter (HOSPITAL_COMMUNITY): Payer: Medicaid Other | Admitting: Anesthesiology

## 2013-04-26 ENCOUNTER — Encounter (HOSPITAL_COMMUNITY): Payer: Self-pay | Admitting: *Deleted

## 2013-04-26 ENCOUNTER — Inpatient Hospital Stay (HOSPITAL_COMMUNITY)
Admission: AD | Admit: 2013-04-26 | Discharge: 2013-04-28 | DRG: 775 | Disposition: A | Discharge status: Home / Self Care | Payer: Medicaid Other | Source: Ambulatory Visit | Attending: Family Medicine | Admitting: Family Medicine

## 2013-04-26 DIAGNOSIS — O360990 Maternal care for other rhesus isoimmunization, unspecified trimester, not applicable or unspecified: Secondary | ICD-10-CM | POA: Diagnosis present

## 2013-04-26 DIAGNOSIS — Z3490 Encounter for supervision of normal pregnancy, unspecified, unspecified trimester: Secondary | ICD-10-CM

## 2013-04-26 DIAGNOSIS — Z3492 Encounter for supervision of normal pregnancy, unspecified, second trimester: Secondary | ICD-10-CM

## 2013-04-26 DIAGNOSIS — IMO0001 Reserved for inherently not codable concepts without codable children: Secondary | ICD-10-CM

## 2013-04-26 DIAGNOSIS — O26899 Other specified pregnancy related conditions, unspecified trimester: Secondary | ICD-10-CM

## 2013-04-26 DIAGNOSIS — O4290 Premature rupture of membranes, unspecified as to length of time between rupture and onset of labor, unspecified weeks of gestation: Secondary | ICD-10-CM | POA: Diagnosis present

## 2013-04-26 DIAGNOSIS — F17200 Nicotine dependence, unspecified, uncomplicated: Secondary | ICD-10-CM

## 2013-04-26 DIAGNOSIS — Z6791 Unspecified blood type, Rh negative: Secondary | ICD-10-CM

## 2013-04-26 LAB — CBC
HCT: 34.1 % — ABNORMAL LOW (ref 36.0–46.0)
HEMOGLOBIN: 12.1 g/dL (ref 12.0–15.0)
MCH: 30.2 pg (ref 26.0–34.0)
MCHC: 35.5 g/dL (ref 30.0–36.0)
MCV: 85 fL (ref 78.0–100.0)
Platelets: 218 10*3/uL (ref 150–400)
RBC: 4.01 MIL/uL (ref 3.87–5.11)
RDW: 12.5 % (ref 11.5–15.5)
WBC: 12.5 10*3/uL — ABNORMAL HIGH (ref 4.0–10.5)

## 2013-04-26 LAB — POCT FERN TEST: POCT Fern Test: POSITIVE

## 2013-04-26 LAB — RPR: RPR: NONREACTIVE

## 2013-04-26 MED ORDER — LACTATED RINGERS IV SOLN
500.0000 mL | Freq: Once | INTRAVENOUS | Status: AC
  Administered 2013-04-26: 500 mL via INTRAVENOUS

## 2013-04-26 MED ORDER — PHENYLEPHRINE 40 MCG/ML (10ML) SYRINGE FOR IV PUSH (FOR BLOOD PRESSURE SUPPORT)
80.0000 ug | PREFILLED_SYRINGE | INTRAVENOUS | Status: DC | PRN
  Filled 2013-04-26: qty 10
  Filled 2013-04-26: qty 2

## 2013-04-26 MED ORDER — OXYCODONE-ACETAMINOPHEN 5-325 MG PO TABS: 1.0000 | ORAL_TABLET | ORAL | Status: DC | PRN

## 2013-04-26 MED ORDER — LIDOCAINE HCL (PF) 1 % IJ SOLN
30.0000 mL | INTRAMUSCULAR | Status: DC | PRN
  Filled 2013-04-26 (×2): qty 30

## 2013-04-26 MED ORDER — CITRIC ACID-SODIUM CITRATE 334-500 MG/5ML PO SOLN: 30.0000 mL | ORAL | Status: DC | PRN

## 2013-04-26 MED ORDER — OXYTOCIN 40 UNITS IN LACTATED RINGERS INFUSION - SIMPLE MED
1.0000 m[IU]/min | INTRAVENOUS | Status: DC
  Administered 2013-04-26: 2 m[IU]/min via INTRAVENOUS
  Administered 2013-04-26: 10 m[IU]/min via INTRAVENOUS
  Administered 2013-04-26: 12 m[IU]/min via INTRAVENOUS

## 2013-04-26 MED ORDER — OXYTOCIN 40 UNITS IN LACTATED RINGERS INFUSION - SIMPLE MED
62.5000 mL/h | INTRAVENOUS | Status: DC
  Administered 2013-04-27: 62.5 mL/h via INTRAVENOUS
  Filled 2013-04-26: qty 1000

## 2013-04-26 MED ORDER — IBUPROFEN 600 MG PO TABS
600.0000 mg | ORAL_TABLET | Freq: Four times a day (QID) | ORAL | Status: DC | PRN
  Administered 2013-04-27: 600 mg via ORAL
  Filled 2013-04-26: qty 1

## 2013-04-26 MED ORDER — ONDANSETRON HCL 4 MG/2ML IJ SOLN
4.0000 mg | Freq: Four times a day (QID) | INTRAMUSCULAR | Status: DC | PRN
  Administered 2013-04-26: 4 mg via INTRAVENOUS
  Filled 2013-04-26: qty 2

## 2013-04-26 MED ORDER — EPHEDRINE 5 MG/ML INJ
10.0000 mg | INTRAVENOUS | Status: DC | PRN
  Filled 2013-04-26: qty 2

## 2013-04-26 MED ORDER — LACTATED RINGERS IV SOLN: 500.0000 mL | INTRAVENOUS | Status: DC | PRN

## 2013-04-26 MED ORDER — FENTANYL 2.5 MCG/ML BUPIVACAINE 1/10 % EPIDURAL INFUSION (WH - ANES)
14.0000 mL/h | INTRAMUSCULAR | Status: DC | PRN
Start: 2013-04-26 — End: 2013-04-27
  Administered 2013-04-26: 14 mL/h via EPIDURAL
  Filled 2013-04-26 (×2): qty 125

## 2013-04-26 MED ORDER — DIPHENHYDRAMINE HCL 50 MG/ML IJ SOLN: 12.5000 mg | INTRAMUSCULAR | Status: DC | PRN

## 2013-04-26 MED ORDER — ACETAMINOPHEN 325 MG PO TABS: 650.0000 mg | ORAL_TABLET | ORAL | Status: DC | PRN

## 2013-04-26 MED ORDER — LACTATED RINGERS IV SOLN
INTRAVENOUS | Status: DC
  Administered 2013-04-26 (×3): via INTRAVENOUS

## 2013-04-26 MED ORDER — FLEET ENEMA 7-19 GM/118ML RE ENEM: 1.0000 | ENEMA | RECTAL | Status: DC | PRN

## 2013-04-26 MED ORDER — PHENYLEPHRINE 40 MCG/ML (10ML) SYRINGE FOR IV PUSH (FOR BLOOD PRESSURE SUPPORT)
80.0000 ug | PREFILLED_SYRINGE | INTRAVENOUS | Status: DC | PRN
  Filled 2013-04-26: qty 2

## 2013-04-26 MED ORDER — OXYTOCIN BOLUS FROM INFUSION
500.0000 mL | INTRAVENOUS | Status: DC
  Administered 2013-04-27: 500 mL via INTRAVENOUS

## 2013-04-26 MED ORDER — LIDOCAINE HCL (PF) 1 % IJ SOLN
INTRAMUSCULAR | Status: DC | PRN
  Administered 2013-04-26 (×4): 4 mL

## 2013-04-26 MED ORDER — EPHEDRINE 5 MG/ML INJ
10.0000 mg | INTRAVENOUS | Status: DC | PRN
  Filled 2013-04-26: qty 2
  Filled 2013-04-26: qty 4

## 2013-04-26 MED ORDER — TERBUTALINE SULFATE 1 MG/ML IJ SOLN: 0.2500 mg | Freq: Once | INTRAMUSCULAR | Status: AC | PRN

## 2013-04-26 NOTE — Anesthesia Procedure Notes (Signed)
Epidural Patient location during procedure: OB Start time: 04/26/2013 9:14 PM  Staffing Performed by: anesthesiologist   Preanesthetic Checklist Completed: patient identified, site marked, surgical consent, pre-op evaluation, timeout performed, IV checked, risks and benefits discussed and monitors and equipment checked  Epidural Patient position: sitting Prep: site prepped and draped and DuraPrep Patient monitoring: continuous pulse ox and blood pressure Approach: midline Injection technique: LOR air  Needle:  Needle type: Tuohy  Needle gauge: 17 G Needle length: 9 cm and 9 Needle insertion depth: 4.5 cm Catheter type: closed end flexible Catheter size: 19 Gauge Catheter at skin depth: 9.5 cm Test dose: negative  Assessment Events: blood not aspirated, injection not painful, no injection resistance, negative IV test and no paresthesia  Additional Notes Discussed risk of headache, infection, bleeding, nerve injury and failed or incomplete block.  Patient voices understanding and wishes to proceed.  Epidural placed easily on first attempt. No paresthesia. Patient tolerated procedure well with no apparent complications.  Jasmine DecemberA> Demetrios Byron, MDReason for block:procedure for pain

## 2013-04-26 NOTE — Anesthesia Preprocedure Evaluation (Signed)
Anesthesia Evaluation  Patient identified by MRN, date of birth, ID band Patient awake    Reviewed: Allergy & Precautions, H&P , NPO status , Patient's Chart, lab work & pertinent test results, reviewed documented beta blocker date and time   History of Anesthesia Complications Negative for: history of anesthetic complications  Airway Mallampati: I TM Distance: >3 FB Neck ROM: full    Dental  (+) Teeth Intact   Pulmonary neg pulmonary ROS, former smoker,  breath sounds clear to auscultation        Cardiovascular negative cardio ROS  Rhythm:regular Rate:Normal     Neuro/Psych negative neurological ROS  negative psych ROS   GI/Hepatic negative GI ROS, Neg liver ROS, (+)       marijuana use,   Endo/Other  negative endocrine ROS  Renal/GU negative Renal ROS     Musculoskeletal   Abdominal   Peds  Hematology negative hematology ROS (+)   Anesthesia Other Findings   Reproductive/Obstetrics (+) Pregnancy                           Anesthesia Physical Anesthesia Plan  ASA: II  Anesthesia Plan: Epidural   Post-op Pain Management:    Induction:   Airway Management Planned:   Additional Equipment:   Intra-op Plan:   Post-operative Plan:   Informed Consent: I have reviewed the patients History and Physical, chart, labs and discussed the procedure including the risks, benefits and alternatives for the proposed anesthesia with the patient or authorized representative who has indicated his/her understanding and acceptance.     Plan Discussed with:   Anesthesia Plan Comments:         Anesthesia Quick Evaluation

## 2013-04-26 NOTE — Progress Notes (Signed)
Danne HarborCiera A Dungan is a 18 y.o. G1P0 at 5858w2d admitted for rupture of membranes  Subjective: Pt contracting on pitocin  Objective: BP 114/51  Pulse 55  Temp(Src) 98.4 F (36.9 C) (Oral)  Resp 18  Ht 5\' 2"  (1.575 m)  Wt 68.21 kg (150 lb 6 oz)  BMI 27.50 kg/m2  SpO2 100%  LMP 08/20/2012      FHT:  FHR: 120s bpm, variability: moderate,  accelerations:  Present,  decelerations:  Absent UC:   regular, every 3 minutes SVE:   Dilation: 3 Effacement (%): 60 Station: -2 Exam by:: Dr. Reola CalkinsBeck  Labs: Lab Results  Component Value Date   WBC 12.5* 04/26/2013   HGB 12.1 04/26/2013   HCT 34.1* 04/26/2013   MCV 85.0 04/26/2013   PLT 218 04/26/2013    Assessment / Plan: Induction of labor due to PROM,  progressing well on pitocin  Labor: Progressing on Pitocin, will continue to increase then AROM Preeclampsia:  no signs or symptoms of toxicity Fetal Wellbeing:  Category I Pain Control:  Epidural I/D:  gbs Neg Anticipated MOD:  NSVD  Yecheskel Kurek RYAN 04/26/2013, 11:28 PM

## 2013-04-26 NOTE — H&P (Signed)
LABOR ADMISSION HISTORY AND PHYSICAL   Anniebelle A Mayford KnifeWilliams is a 18 y.o. female G1P0 with IUP at 5543w2d by LMP c/w 16week US presenting for Rupture of Membranes. States that around 6:30 this am had a large wet area on her bed sheets.  Went to the bathroom and continued to leak clear fluid so decided to come in.  No VB.  +FM. Having some contractions but irregular.    [place] 16 [place]. PNC has been uncomplicated. She is RH negative. GBS neg. US showed and echogenic cardiac focus but no other abnormalities.    Prenatal History/Complications:  Past Medical History: Past Medical History  Diagnosis Date  . Gallbladder colic     Past Surgical History: History reviewed. No pertinent past surgical history.  Obstetrical History: OB History   Grav Para Term Preterm Abortions TAB SAB Ect Mult Living   1         0      Gynecological History: OB History   Grav Para Term Preterm Abortions TAB SAB Ect Mult Living   1         0      Social History: History   Social History  . Marital Status: Single    Spouse Name: N/A    Number of Children: N/A  . Years of Education: N/A   Occupational History  . homemaker    Social History Main Topics  . Smoking status: Former Smoker -- 0.25 packs/day for 7 years    Types: Cigarettes    Quit date: 03/26/2012  . Smokeless tobacco: Never Used  . Alcohol Use: No  . Drug Use: Yes     Comment: marijuana  . Sexual Activity: Yes    Partners: Male   Other Topics Concern  . None   Social History Narrative  . None    Family History: Family History  Problem Relation Age of Onset  . Heart attack Maternal Grandfather   . Heart attack Maternal Grandfather     great gf    Allergies: No Known Allergies  Facility-administered medications prior to admission  Medication Dose Route Frequency Provider Last Rate Last Dose  . influenza vac split quadrivalent PF (FLUARIX) injection 0.5 mL  0.5 mL Intramuscular Once Aviva SignsMarie L Carver,  CNM      . rho(d) immune globulin (RHIG/Rhophylac) injection 300 mcg  300 mcg Intramuscular Once Hurshel PartyLisa A Leftwich-Kirby, CNM      . TDaP (BOOSTRIX) injection 0.5 mL  0.5 mL Intramuscular Once Hurshel PartyLisa A Leftwich-Kirby, CNM       Prescriptions prior to admission  Medication Sig Dispense Refill  . calcium carbonate (TUMS - DOSED IN MG ELEMENTAL CALCIUM) 500 MG chewable tablet Chew 2 tablets by mouth as needed for indigestion or heartburn.       . Prenatal Vit-Fe Fumarate-FA (PRENATAL MULTIVITAMIN) TABS tablet Take 1 tablet by mouth daily at 12 noon.      . Simethicone (GAS-X PO) Take 2 tablets by mouth 2 (two) times daily as needed (gas).         Review of Systems  All systems reviewed and negative except as stated in HPI    Blood pressure 145/82, pulse 90, temperature 98.6 F (37 C), temperature source Oral, resp. rate 18, height 5\' 2"  (1.575 m), weight 68.21 kg (150 lb 6 oz), last menstrual period 08/20/2012, SpO2 96.00%. General appearance: alert and cooperative Lungs: clear to auscultation bilaterally Heart: regular rate and rhythm Abdomen: soft, non-tender; bowel sounds normal Extremities:  Homans sign is negative, no sign of DVT DTR's 2+ Presentation: cephalic Fetal monitoringBaseline: 140 bpm, Variability: Good {> 6 bpm), Accelerations: Reactive and Decelerations: Absent Uterine activity: irregular contractions Dilation: 3 Effacement (%): 60 Station: -2 Exam by:: Dr. Reola Calkins   Prenatal labs: ABO, Rh: A/NEG/-- (08/04 1231) Antibody: NEG (10/31 1146) Rubella:   RPR: NON REACTIVE (01/07 1505)  HBsAg: NEGATIVE (08/04 1231)  HIV: NON REACTIVE (10/31 1032)  GBS: Negative (12/22 0000)  1 hr Glucola 56 Genetic screening  Quad obtained too late Anatomy US Echogenic focus; all other normal    Assessment: Shandelle A Dunstan is a 18 y.o. G1P0 with an IUP at [redacted]w[redacted]d presenting for Rupture of Membranes(SOOL)  Plan:  1) Admit to L&D - routine orders  2) SROM - currently contracting  on her own - will observe and if no cervical change, plan to augment with pitocin  3) FWB - cat I tracing - GBS neg - EFW 6.5lb  4) Anticipate SVD     Donat Humble L 04/26/2013, 3:00 PM

## 2013-04-26 NOTE — MAU Note (Signed)
BobbiJo, RN charge notified of pt. Pt to go to room 170.

## 2013-04-26 NOTE — H&P (Signed)
Attestation of Attending Supervision of Advanced Practitioner (PA/CNM/NP): Evaluation and management procedures were performed by the Advanced Practitioner under my supervision and collaboration.  I have reviewed the Advanced Practitioner's note and chart, and I agree with the management and plan.  Reva BoresPRATT,Kayra Crowell S, MD [facility] Attending 04/26/2013 5:37 PM

## 2013-04-27 ENCOUNTER — Encounter (HOSPITAL_COMMUNITY): Payer: Self-pay | Admitting: *Deleted

## 2013-04-27 DIAGNOSIS — O4290 Premature rupture of membranes, unspecified as to length of time between rupture and onset of labor, unspecified weeks of gestation: Secondary | ICD-10-CM

## 2013-04-27 MED ORDER — DIPHENHYDRAMINE HCL 25 MG PO CAPS: 25.0000 mg | ORAL_CAPSULE | Freq: Four times a day (QID) | ORAL | Status: DC | PRN

## 2013-04-27 MED ORDER — OXYCODONE-ACETAMINOPHEN 5-325 MG PO TABS
1.0000 | ORAL_TABLET | ORAL | Status: DC | PRN
  Administered 2013-04-27: 2 via ORAL
  Administered 2013-04-27: 1 via ORAL
  Administered 2013-04-28: 2 via ORAL
  Administered 2013-04-28 (×2): 1 via ORAL
  Filled 2013-04-27: qty 1
  Filled 2013-04-27 (×3): qty 2

## 2013-04-27 MED ORDER — ONDANSETRON HCL 4 MG/2ML IJ SOLN: 4.0000 mg | INTRAMUSCULAR | Status: DC | PRN

## 2013-04-27 MED ORDER — RHO D IMMUNE GLOBULIN 1500 UNIT/2ML IJ SOLN
300.0000 ug | Freq: Once | INTRAMUSCULAR | Status: AC
  Administered 2013-04-27: 300 ug via INTRAMUSCULAR
  Filled 2013-04-27: qty 2

## 2013-04-27 MED ORDER — BENZOCAINE-MENTHOL 20-0.5 % EX AERO
1.0000 "application " | INHALATION_SPRAY | CUTANEOUS | Status: DC | PRN
  Administered 2013-04-27: 1 via TOPICAL
  Filled 2013-04-27: qty 56

## 2013-04-27 MED ORDER — SENNOSIDES-DOCUSATE SODIUM 8.6-50 MG PO TABS
2.0000 | ORAL_TABLET | ORAL | Status: DC
  Administered 2013-04-28: 2 via ORAL
  Filled 2013-04-27: qty 2

## 2013-04-27 MED ORDER — TETANUS-DIPHTH-ACELL PERTUSSIS 5-2.5-18.5 LF-MCG/0.5 IM SUSP: 0.5000 mL | Freq: Once | INTRAMUSCULAR | Status: DC

## 2013-04-27 MED ORDER — PRENATAL MULTIVITAMIN CH
1.0000 | ORAL_TABLET | Freq: Every day | ORAL | Status: DC
  Administered 2013-04-27 – 2013-04-28 (×2): 1 via ORAL
  Filled 2013-04-27 (×2): qty 1

## 2013-04-27 MED ORDER — WITCH HAZEL-GLYCERIN EX PADS: 1.0000 "application " | MEDICATED_PAD | CUTANEOUS | Status: DC | PRN

## 2013-04-27 MED ORDER — IBUPROFEN 600 MG PO TABS
600.0000 mg | ORAL_TABLET | Freq: Four times a day (QID) | ORAL | Status: DC
  Administered 2013-04-27 – 2013-04-28 (×6): 600 mg via ORAL
  Filled 2013-04-27 (×6): qty 1

## 2013-04-27 MED ORDER — ZOLPIDEM TARTRATE 5 MG PO TABS: 5.0000 mg | ORAL_TABLET | Freq: Every evening | ORAL | Status: DC | PRN

## 2013-04-27 MED ORDER — LANOLIN HYDROUS EX OINT: TOPICAL_OINTMENT | CUTANEOUS | Status: DC | PRN

## 2013-04-27 MED ORDER — ONDANSETRON HCL 4 MG PO TABS: 4.0000 mg | ORAL_TABLET | ORAL | Status: DC | PRN

## 2013-04-27 MED ORDER — SIMETHICONE 80 MG PO CHEW
80.0000 mg | CHEWABLE_TABLET | ORAL | Status: DC | PRN
Start: 2013-04-27 — End: 2013-04-28

## 2013-04-27 MED ORDER — DIBUCAINE 1 % RE OINT: 1.0000 "application " | TOPICAL_OINTMENT | RECTAL | Status: DC | PRN

## 2013-04-27 NOTE — Anesthesia Postprocedure Evaluation (Signed)
  Anesthesia Post-op Note  Patient: Michele Perkins  Procedure(s) Performed: * No procedures listed *  Patient Location: Mother/Baby  Anesthesia Type:Epidural  Level of Consciousness: awake, alert , oriented and patient cooperative  Airway and Oxygen Therapy: Patient Spontanous Breathing  Post-op Pain: mild  Post-op Assessment: Patient's Cardiovascular Status Stable, Respiratory Function Stable, No headache, No backache, No residual numbness and No residual motor weakness  Post-op Vital Signs: stable  Complications: No apparent anesthesia complications

## 2013-04-27 NOTE — Clinical Social Work Maternal (Signed)
    Clinical Social Work Department PSYCHOSOCIAL ASSESSMENT - MATERNAL/CHILD 04/27/2013  Patient:  Michele Perkins,Michele Perkins  Account Number:  [id number]  Admit Date:  04/26/2013  Marjo Bickerhilds Name:   Michele RamusLillian Perkins    Clinical Social Worker:  Nobie PutnamEDRA Lyliana Dicenso, LCSW   Date/Time:  04/27/2013 01:33 PM  Date Referred:  04/27/2013   Referral source  CN     Referred reason  Substance Abuse   Other referral source:    I:  FAMILY / HOME ENVIRONMENT Child's legal guardian:  PARENT  Guardian - Name Guardian - Age Guardian - Address  Michele Perkins 17 [address] [place], [place] [id number]  Rogelia MireZachary Perkins 19 (same as above)   Other household support members/support persons Name Relationship DOB  Michele GearingWanda Perkins GRAND MOTHER    Other support:    II  PSYCHOSOCIAL DATA Information Source:  Patient Interview  Surveyor, quantityinancial and Community Resources Employment:   Financial resources:  [employer] If Medicaid - County:  [place] Other  Sales executiveood Stamps  WIC   School / Grade:   Maternity Care Coordinator / Child Services Coordination / Early Interventions:  Cultural issues impacting care:    III  STRENGTHS Strengths  Adequate Resources  Home prepared for Child (including basic supplies)  Supportive family/friends   Strength comment:    IV  RISK FACTORS AND CURRENT PROBLEMS Current Problem:  YES   Risk Factor & Current Problem Patient Issue Family Issue Risk Factor / Current Problem Comment  Substance Abuse Y N Hx of MJ    V  SOCIAL WORK ASSESSMENT CSW referral received to assess pt's history of MJ use.  Pt admits to smoking MJ "Perkins couple times Perkins month" prior to pregnancy confirmation at 2 weeks.  Once pregnancy was confirmed, she continue to smoke until her 6th month of pregnancy.  She denies any other illegal substance use & verbalized an understanding of hospital drug testing policy.  UDS & meconium collection is pending.  Pt denies any history of depression, anxiety or SI.  FOB at the bedside &  appears supportive.  The pt has all the necessary supplies for the infant & appears to be bonding appropriately at this time.  CSW will continue to monitor drug screen results & make Perkins referral if needed.      VI SOCIAL WORK PLAN Social Work Plan  No Further Intervention Required / No Barriers to Discharge   Type of pt/family education:   If child protective services report - county:   If child protective services report - date:   Information/referral to community resources comment:   Other social work plan:

## 2013-04-27 NOTE — Progress Notes (Signed)
Michele Perkins is a 18 y.o. G1P0 at 2158w2d admitted for rupture of membranes  Subjective: Pt contracting on pitocin and making change. comfortabl with epidrual  Objective: BP 124/74  Pulse 79  Temp(Src) 98.8 F (37.1 C) (Oral)  Resp 18  Ht 5\' 2"  (1.575 m)  Wt 68.21 kg (150 lb 6 oz)  BMI 27.50 kg/m2  SpO2 100%  LMP 08/20/2012      FHT:  FHR: 120s bpm, variability: moderate,  accelerations:  Present,  decelerations:  Absent UC:   regular, every 3 minutes SVE:   Dilation: 6 Effacement (%): 80 Station: -1 Exam by:: Dr. Ike Benedom  Labs: Lab Results  Component Value Date   WBC 12.5* 04/26/2013   HGB 12.1 04/26/2013   HCT 34.1* 04/26/2013   MCV 85.0 04/26/2013   PLT 218 04/26/2013    Assessment / Plan: Induction of labor due to PROM,  progressing well on pitocin  Labor: Progressing on Pitocin, will continue to increase then AROM Preeclampsia:  no signs or symptoms of toxicity Fetal Wellbeing:  Category I Pain Control:  Epidural I/D:  gbs Neg Anticipated MOD:  NSVD  Michele Perkins 04/27/2013, 12:36 AM

## 2013-04-27 NOTE — Progress Notes (Signed)
Michele Perkins is a 18 y.o. G1P0 at 2272w2d admitted for rupture of membranes  Subjective: Pt contracting on pitocin and making change. comfortabl with epidrual. No complaints  Objective: BP 134/82  Pulse 74  Temp(Src) 98.7 F (37.1 C) (Oral)  Resp 18  Ht 5\' 2"  (1.575 m)  Wt 68.21 kg (150 lb 6 oz)  BMI 27.50 kg/m2  SpO2 100%  LMP 08/20/2012      FHT:  FHR: 130s bpm, variability: moderate,  accelerations:  Present,  decelerations:  Absent UC:   regular, every 2-3 minutes SVE:   Dilation: 10 Effacement (%): 100 Station: +1 Exam by:: Dr.Truda Staub  Labs: Lab Results  Component Value Date   WBC 12.5* 04/26/2013   HGB 12.1 04/26/2013   HCT 34.1* 04/26/2013   MCV 85.0 04/26/2013   PLT 218 04/26/2013    Assessment / Plan: Induction of labor due to PROM,  progressing well on pitocin  Labor: now complete, will allow to labor down for 1hr or need to push Preeclampsia:  no signs or symptoms of toxicity Fetal Wellbeing:  Category I Pain Control:  Epidural I/D:  gbs Neg Anticipated MOD:  NSVD  Michele Perkins 04/27/2013, 3:47 AM

## 2013-04-27 NOTE — Lactation Note (Signed)
This note was copied from the chart of Michele Perkins. Lactation Consultation Note  Patient Name: Michele Earl ManyCiera Monteleone WUJWJ'XToday's Date: 04/27/2013 Reason for consult: Initial assessment;Other (Comment) (hx of maternal drug use (MJ)).  This primipara has newborn (dressed) in cradle position on (R) breast on boppy pillow and reports that baby seems to prefer this breast but has latched to (L) side, as well.  LC reviewed signs of proper latch, hand expression and nipple care, written list of possible side effects from marijuana on breastfeeding infant, basics of cue feedings, STS and milk production.  LC encouraged review of Baby and Me pp 9, 14 and 20-25 for STS and BF information. LC provided [employer] Resource brochure and reviewed [facility] services and list of community and web site resources.    Maternal Data Formula Feeding for Exclusion: Yes Reason for exclusion: Substance abuse and/or alcohol abuse (urine drug screen negative for baby) Infant to breast within first hour of birth: Yes Has patient been taught Hand Expression?: Yes (mom informs LC that her nurse has shown her hand expression) Does the patient have breastfeeding experience prior to this delivery?: No  Feeding Feeding Type: Breast Fed  LATCH Score/Interventions              initial LATCH score=7 and baby has breastfed additional four times since birth        Lactation Tools Discussed/Used   STS, cue feeding, hand expression  Consult Status Consult Status: Follow-up Date: 04/28/13 Follow-up type: In-patient    Warrick ParisianBryant, Varian Innes [facility] 04/27/2013, 7:31 PM

## 2013-04-28 LAB — RH IG WORKUP (INCLUDES ABO/RH)
ABO/RH(D): A NEG
Antibody Screen: NEGATIVE
Fetal Screen: NEGATIVE
Gestational Age(Wks): 39.3
UNIT DIVISION: 0

## 2013-04-28 MED ORDER — IBUPROFEN 600 MG PO TABS: 600.0000 mg | ORAL_TABLET | Freq: Four times a day (QID) | ORAL | Status: AC

## 2013-04-28 NOTE — Anesthesia Postprocedure Evaluation (Signed)
  Anesthesia Post-op Note  Anesthesia Post Note  Patient: Michele Perkins  Procedure(s) Performed: * No procedures listed *  Anesthesia type: Epidural  Patient location: Mother/Baby  Post pain: Pain level controlled  Post assessment: Post-op Vital signs reviewed  Last Vitals:  Filed Vitals:   04/28/13 0614  BP: 138/77  Pulse: 53  Temp: 36.7 C  Resp: 17    Post vital signs: Reviewed  Level of consciousness:alert  Complications: No apparent anesthesia complications

## 2013-04-28 NOTE — Discharge Instructions (Signed)

## 2013-04-28 NOTE — Discharge Summary (Signed)
Attestation of Attending Supervision of Advanced Practitioner (CNM/NP): Evaluation and management procedures were performed by the Advanced Practitioner under my supervision and collaboration.  I have reviewed the Advanced Practitioner's note and chart, and I agree with the management and plan.  Jasmond River 04/28/2013 10:50 AM

## 2013-04-28 NOTE — Progress Notes (Signed)
UR chart review completed.  

## 2013-04-28 NOTE — Discharge Summary (Signed)
Obstetric Discharge Summary Reason for Admission: onset of labor and rupture of membranes Prenatal Procedures: none Intrapartum Procedures: spontaneous vaginal delivery Postpartum Procedures: none Complications-Operative and Postpartum: labial tear, repaired  Hemoglobin  Date Value Range Status  04/26/2013 12.1  12.0 - 15.0 g/dL Final     HCT  Date Value Range Status  04/26/2013 34.1* 36.0 - 46.0 % Final    Hospital Course: Michele Perkins is a 18 y.o. 411P1001 female who presented at 7161w3d in active labor.  NSVD with labial tear, repaired.  Healthy baby girl delivered ROA with APGARS 8,9.  PPD #1.  Pain is minimal.  Bleeding has improved and described slightly more than a period, changing pad q4hours.  Patient has light period in general.  Ambulating well and tolerating PO.  Patient is passing flatus, no BMs yet.  Patient is wants patch for MOC.  She will be breastfeeding.  SW following due to hx of MJ use in past.  PT admits to MJ use up until 6 mo of pregnancy.  Infant UDS was negative and Meconium drug screen is pending.  FOB at bedside.  Schedule post-partum f/u appt in 4-6 weeks.  Physical Exam:  General: alert, cooperative and no distress Lochia: appropriate Uterine Fundus: firm Incision: n/a DVT Evaluation: No evidence of DVT seen on physical exam.  Discharge Diagnoses: Term Pregnancy-delivered  Discharge Information: Date: 04/28/2013 Activity: pelvic rest Diet: routine Medications: Ibuprofen Condition: stable Instructions: refer to practice specific booklet Discharge to: home   Newborn Data: Live born female  Birth Weight: 7 lb 5.5 oz (3330 g) APGAR: 8, 9  Home with mother.  TUCKER, BRITTON L 04/28/2013, 7:52 AM  I have seen and examined this patient and agree with above documentation in the PA student's note. Pt complaining of bleeding but in review with nursing, bleeding appropriate. None on my exam with fundal massage.  Ok to d/c to home.  Rulon AbideKeli Matei Perkins, M.D. [facility]  Fellow 04/28/2013 9:32 AM

## 2013-04-29 ENCOUNTER — Other Ambulatory Visit: Payer: Self-pay | Admitting: Obstetrics and Gynecology

## 2013-04-29 ENCOUNTER — Ambulatory Visit: Payer: Self-pay

## 2013-04-29 MED ORDER — OXYCODONE-ACETAMINOPHEN 5-325 MG PO TABS: 1.0000 | ORAL_TABLET | ORAL | Status: DC | PRN

## 2013-04-29 NOTE — Lactation Note (Signed)
This note was copied from the chart of Michele Perkins Daily. Lactation Consultation Note  Patient Name: Michele Perkins Stgermain EAVWU'JToday's Date: 04/29/2013 Reason for consult: Follow-up assessment;Breast/nipple pain Mom c/o sore nipples. Assisted Mom with positioning and obtaining more depth with latch. Mom had pain with initial latch that slowly improved as the baby was breastfeeding. Care for sore nipples reviewed, comfort gels given with instructions. Basic teaching reviewed. Advised of OP services and support group.  Maternal Data    Feeding Feeding Type: Breast Fed Length of feed: 15 min  LATCH Score/Interventions Latch: Grasps breast easily, tongue down, lips flanged, rhythmical sucking.  Audible Swallowing: A few with stimulation  Type of Nipple: Everted at rest and after stimulation  Comfort (Breast/Nipple): Filling, red/small blisters or bruises, mild/mod discomfort  Problem noted: Severe discomfort (right nipple) Interventions (Mild/moderate discomfort): Comfort gels (EBM to sore nipples)  Hold (Positioning): Assistance needed to correctly position infant at breast and maintain latch.  LATCH Score: 7  Lactation Tools Discussed/Used Tools: Comfort gels   Consult Status Consult Status: Complete Date: 04/29/13 Follow-up type: In-patient    Alfred LevinsGranger, Kimbella Heisler Ann 04/29/2013, 11:52 AM

## 2013-06-08 ENCOUNTER — Telehealth: Payer: Self-pay | Admitting: *Deleted

## 2013-06-08 ENCOUNTER — Ambulatory Visit: Payer: Medicaid Other | Admitting: Obstetrics & Gynecology

## 2013-06-08 NOTE — Telephone Encounter (Signed)
Left message on the phone number provided that the office would be closed today due to inclement weather.  She is to call office to reschedule.

## 2013-06-14 ENCOUNTER — Encounter: Payer: Self-pay | Admitting: Advanced Practice Midwife

## 2013-06-14 ENCOUNTER — Ambulatory Visit (INDEPENDENT_AMBULATORY_CARE_PROVIDER_SITE_OTHER): Payer: Medicaid Other | Admitting: Advanced Practice Midwife

## 2013-06-14 VITALS — BP 115/82 | HR 72 | Resp 16 | Ht 62.0 in | Wt 138.0 lb

## 2013-06-14 DIAGNOSIS — Z308 Encounter for other contraceptive management: Secondary | ICD-10-CM

## 2013-06-14 DIAGNOSIS — Z3045 Encounter for surveillance of transdermal patch hormonal contraceptive device: Secondary | ICD-10-CM

## 2013-06-14 MED ORDER — NORELGESTROMIN-ETH ESTRADIOL 150-35 MCG/24HR TD PTWK: 1.0000 | MEDICATED_PATCH | TRANSDERMAL | Status: DC

## 2013-06-14 NOTE — Patient Instructions (Signed)
Take home pregnancy test in 1 week. If negative you may start your contraceptive patch.   Estradiol; Levonorgestrel skin patches What is this medicine? ESTRADIOL; LEVONORGESTREL (es tra DYE ole; LEE voh nor jes trel) is used as hormone replacement in menopausal women who still have their uterus. This medicine is used to relieve the symptoms of menopause. It also helps to prevent osteoporosis in postmenopausal women. This medicine may be used for other purposes; ask your health care provider or pharmacist if you have questions. COMMON BRAND NAME(S): Climara Pro What should I tell my health care provider before I take this medicine? They need to know if you have any of these conditions: -blood vessel disease or blood clots -breast, cervical, endometrial, or uterine cancer -diabetes -endometriosis -fibroids -gallbladder disease -heart disease or recent heart attack -high blood cholesterol -high blood pressure -high level of calcium in the blood -hysterectomy -kidney disease -liver disease -mental depression -migraine headaches -porphyria -stroke -systemic lupus erythematosus (SLE) -tobacco smoker -vaginal bleeding -an unusual or allergic reaction to estrogens, progestins, other medicines, foods, dyes, or preservatives -pregnant or trying to get pregnant -breast-feeding How should I use this medicine? This medicine is for external use only. Follow the directions on the prescription label. Tear open the pouch, do not use scissors. Remove the stiff protective liner covering the adhesive. Try not to touch the adhesive. Apply the patch, sticky side to the skin, to an area that is clean, dry and hairless. Avoid injured, irritated, calloused, or scarred areas. Do not apply the skin patches to your breasts or around the waistline. Use a different site each time to prevent skin irritation. Do not cut or trim the patch. Do not stop using except on the advice of your doctor or health care  professional. Do not wear more than one patch at a time unless you are told to do so by your doctor or health care professional. Contact your pediatrician regarding the use of this medicine in children. Special care may be needed. A patient package insert for the product will be given with each prescription and refill. Read this sheet carefully each time. The sheet may change frequently. Overdosage: If you think you have taken too much of this medicine contact a poison control center or emergency room at once. NOTE: This medicine is only for you. Do not share this medicine with others. What if I miss a dose? If you miss a dose, apply it as soon as you can. If it is almost time for your next dose, apply only that dose. Do not apply double or extra doses. What may interact with this medicine? Do not take this medicine with any of the following medications: -aromatase inhibitors like aminoglutethimide, anastrozole, exemestane, letrozole, testolactone This medicine may also interact with the following medications: -barbiturates, like phenobarbital -carbamazepine -certain antibiotics used to treat infections -grapefruit juice -medicines for fungus infections like itraconazole and ketoconazole -raloxifene or tamoxifen -rifabutin, rifampin, or rifapentine -ritonavir -St. John's Wort This list may not describe all possible interactions. Give your health care provider a list of all the medicines, herbs, non-prescription drugs, or dietary supplements you use. Also tell them if you smoke, drink alcohol, or use illegal drugs. Some items may interact with your medicine. What should I watch for while using this medicine? Visit your doctor or health care professional for regular checks on your progress. You will need a regular breast and pelvic exam and Pap smear while on this medicine. You should also discuss the need for  regular mammograms with your health care professional, and follow his or her guidelines  for these tests. This medicine can make your body retain fluid, making your fingers, hands, or ankles swell. Your blood pressure can go up. Contact your doctor or health care professional if you feel you are retaining fluid. If you have any reason to think you are pregnant, stop taking this medicine right away and contact your doctor or health care professional. Smoking increases the risk of getting a blood clot or having a stroke while you are taking this medicine, especially if you are more than 20 years old. You are strongly advised not to smoke. If you wear contact lenses and notice visual changes, or if the lenses begin to feel uncomfortable, consult your eye doctor or health care professional. If you are going to have surgery or an MRI, you may need to stop taking this medicine. Consult your health care professional for advice before you schedule the surgery. You may bathe or participate in other activities while wearing your patch. If the patch pulls loose or falls off, you may reapply it if the patch is sticky enough to stay on the skin. You should reapply the patch in a different area. Use a fresh patch if it will no longer stick. What side effects may I notice from receiving this medicine? Side effects that you should report to your doctor or health care professional as soon as possible: -breakthrough bleeding and spotting -breast tissue changes or discharge -chest pain -leg, arm or groin pain -nausea, vomiting -severe headaches -severe stomach pain -sudden shortness of breath -yellowing of the eyes or skin Side effects that usually do not require medical attention (report to your doctor or health care professional if they continue or are bothersome): -changes in sexual desire -mood changes, anxiety, depression, frustration, anger, or emotional outbursts -increased or decreased appetite -skin rash, acne, or brown spots on the skin -symptoms of vaginal infection like itching,  irritation or unusual discharge -weight gain This list may not describe all possible side effects. Call your doctor for medical advice about side effects. You may report side effects to FDA at 1-800-FDA-1088. Where should I keep my medicine? Keep out of the reach of children. Store at room temperature between 15 and 30 degrees C (59 and 86 degrees F). Do not store any patches that have been removed from their protective pouch. Throw away any unused medicine after the expiration date. Dispose of used patches properly. Since used patches may still contain active hormones, fold the patch in half so that it sticks to itself prior to disposal. NOTE: This sheet is a summary. It may not cover all possible information. If you have questions about this medicine, talk to your doctor, pharmacist, or health care provider.  2014, Elsevier/Gold Standard. (2008-03-30 16:39:00)

## 2013-06-14 NOTE — Progress Notes (Signed)
Subjective:     Michele Perkins is a 18 y.o. female who presents for a postpartum visit. She is 6 weeks postpartum following a spontaneous vaginal delivery. I have fully reviewed the prenatal and intrapartum course. The delivery was at 39.3 gestational weeks. Outcome: spontaneous vaginal delivery. Anesthesia: epidural. Postpartum course has been normal. Baby's course has been normal. Baby is feeding by bottle. . Bleeding no bleeding. Bowel function is normal. Bladder function is normal. Patient is sexually active. Had unprotected IC 1 week ago. Reports what she considers appropriate tenderness at site of labial lac. Planned contraception method is Ortho-Evra patches weekly. Has not started. Postpartum depression screening: negative.  The following portions of the patient's history were reviewed and updated as appropriate: allergies, past family history, past medical history, past social history, past surgical history and problem list.  Review of Systems Pertinent items are noted in HPI.   Objective:    BP 115/82  Pulse 72  Resp 16  Ht 5\' 2"  (1.575 m)  Wt 138 lb (62.596 kg)  BMI 25.23 kg/m2  Breastfeeding? No  General:  alert, cooperative, appears stated age and no distress   Breasts:  declines  Lungs: clear to auscultation bilaterally  Heart:  regular rate and rhythm, S1, S2 normal, no murmur, click, rub or gallop  Abdomen: soft, non-tender; bowel sounds normal; no masses,  no organomegaly and fundus non-palpable   Vulva:  declined exam  Vagina: declined                    Neg UPT.  Assessment:     Normal postpartum exam. Pap smear not done at today's visit due to age < 7121.   Plan:    1. Contraception: Take home UPT in 1 week. Use barrier method until then. May start Ortho-Evra patches weekly if UPT neg.  2. Follow up in: 1 year or as needed.

## 2014-02-14 ENCOUNTER — Encounter: Payer: Self-pay | Admitting: Advanced Practice Midwife

## 2014-04-25 DIAGNOSIS — F332 Major depressive disorder, recurrent severe without psychotic features: Secondary | ICD-10-CM | POA: Insufficient documentation

## 2014-04-25 DIAGNOSIS — Z818 Family history of other mental and behavioral disorders: Secondary | ICD-10-CM | POA: Insufficient documentation

## 2014-04-25 DIAGNOSIS — R4586 Emotional lability: Secondary | ICD-10-CM | POA: Insufficient documentation

## 2014-05-03 ENCOUNTER — Ambulatory Visit (INDEPENDENT_AMBULATORY_CARE_PROVIDER_SITE_OTHER): Payer: MEDICAID | Admitting: Physician Assistant

## 2014-05-03 ENCOUNTER — Encounter (HOSPITAL_COMMUNITY): Payer: Self-pay | Admitting: Physician Assistant

## 2014-05-03 VITALS — BP 120/67 | HR 67 | Ht 62.0 in | Wt 150.0 lb

## 2014-05-03 DIAGNOSIS — K319 Disease of stomach and duodenum, unspecified: Secondary | ICD-10-CM | POA: Insufficient documentation

## 2014-05-03 DIAGNOSIS — Z630 Problems in relationship with spouse or partner: Secondary | ICD-10-CM

## 2014-05-03 DIAGNOSIS — O99345 Other mental disorders complicating the puerperium: Principal | ICD-10-CM

## 2014-05-03 DIAGNOSIS — F53 Postpartum depression: Secondary | ICD-10-CM

## 2014-05-03 MED ORDER — SERTRALINE HCL 25 MG PO TABS: 25.0000 mg | ORAL_TABLET | Freq: Every day | ORAL | Status: AC
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
## 2014-05-03 NOTE — Patient Instructions (Signed)
1. Set up daily schedule. 2. Include daily exercise for yourself. 3. Call for questions and problems. 4. Follow up in 2 weeks. 5. Take Vitamin D3 each day. 6. Take Bcomplex each day.

## 2014-08-08 ENCOUNTER — Encounter: Payer: Self-pay | Admitting: Obstetrics & Gynecology

## 2014-08-08 ENCOUNTER — Ambulatory Visit (INDEPENDENT_AMBULATORY_CARE_PROVIDER_SITE_OTHER): Payer: Medicaid Other | Admitting: Obstetrics & Gynecology

## 2014-08-08 VITALS — BP 134/80 | HR 66 | Resp 16 | Wt 170.0 lb

## 2014-08-08 DIAGNOSIS — Z202 Contact with and (suspected) exposure to infections with a predominantly sexual mode of transmission: Secondary | ICD-10-CM

## 2014-08-08 DIAGNOSIS — N9089 Other specified noninflammatory disorders of vulva and perineum: Secondary | ICD-10-CM | POA: Diagnosis not present

## 2014-08-08 DIAGNOSIS — L292 Pruritus vulvae: Secondary | ICD-10-CM

## 2014-08-08 MED ORDER — CLOBETASOL PROPIONATE 0.05 % EX OINT: TOPICAL_OINTMENT | CUTANEOUS | Status: DC

## 2014-08-08 NOTE — Progress Notes (Signed)
   Subjective:    Patient ID: Michele Perkins, female    DOB: 08-27-1995, 19 y.o.   MRN: [id number]  HPI  Pt presents for STD testing due to both herself and her partner having other sexual partners in the past year.  She has noticed a bump on her left labia minora near introitus around 5 o'clock.  Bump comes and goes.  No bleeding.  No odor, no discharge.  No pain.  No crusting of bump.  Nothing makes bump worse or better.    Pt also c/o several year history of perineal itching and burning which causes her to scratch until she bleeds.  It has worsened since child bearing.  Pt has no associated discharge.  Denies trauma.  Past Medical History  Diagnosis Date  . Gallbladder colic   . ADHD (attention deficit hyperactivity disorder)     Review of Systems  Constitutional: Negative.   Respiratory: Negative.   Cardiovascular: Negative.   Gastrointestinal: Negative.   Genitourinary:       Bump on left labia Perineal itching and burning.       Objective:   Physical Exam  Constitutional: She is oriented to person, place, and time. She appears well-developed and well-nourished. No distress.  HENT:  Head: Normocephalic and atraumatic.  Eyes: Conjunctivae are normal.  Pulmonary/Chest: Effort normal.  Abdominal: Soft. Bowel sounds are normal. She exhibits no distension and no mass. There is no tenderness. There is no rebound and no guarding.  Genitourinary:     Musculoskeletal: She exhibits no edema.  Neurological: She is alert and oriented to person, place, and time.  Skin: Skin is warm and dry.  Psychiatric: She has a normal mood and affect.  Vitals reviewed.         Assessment & Plan:  19 yo female with 3 issues  1-possible exposure to STDs--full panel of testing; boyfriend to be tested.  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  Offered removal so she can get pathological diagnosis of wart since not 100% sure this is the diagnosis vs. TCA.  Pt would like the removal.     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  4-Needs first pap smear this year.  5-RTC 1 month for vulvar lump removal and to evaluate the perineum.  If not better, will need biopsy of the perineum, too.

## 2014-08-09 LAB — RPR

## 2014-08-09 LAB — HEPATITIS C ANTIBODY: HCV Ab: NEGATIVE

## 2014-08-09 LAB — GC/CHLAMYDIA PROBE AMP
CT Probe RNA: NEGATIVE
GC PROBE AMP APTIMA: NEGATIVE

## 2014-08-09 LAB — HIV ANTIBODY (ROUTINE TESTING W REFLEX): HIV 1&2 Ab, 4th Generation: NONREACTIVE

## 2014-08-09 LAB — HEPATITIS B SURFACE ANTIGEN: HEP B S AG: NEGATIVE

## 2014-08-10 ENCOUNTER — Other Ambulatory Visit: Payer: Self-pay | Admitting: Obstetrics & Gynecology

## 2014-08-10 ENCOUNTER — Telehealth: Payer: Self-pay | Admitting: *Deleted

## 2014-08-10 LAB — WET PREP BY MOLECULAR PROBE
Candida species: NEGATIVE
Gardnerella vaginalis: POSITIVE — AB
TRICHOMONAS VAG: NEGATIVE

## 2014-08-10 MED ORDER — METRONIDAZOLE 500 MG PO TABS: 500.0000 mg | ORAL_TABLET | Freq: Two times a day (BID) | ORAL | Status: DC

## 2014-08-10 NOTE — Telephone Encounter (Signed)
LM on voicemail of positive wet prep and RX was sent to her pharmacy.  Pt instructed not to drink ETOH during the time period of taking the med.

## 2014-08-12 LAB — HERPES SIMPLEX VIRUS CULTURE: ORGANISM ID, BACTERIA: NOT DETECTED

## 2014-09-06 ENCOUNTER — Ambulatory Visit: Payer: Self-pay | Admitting: Obstetrics & Gynecology

## 2014-09-07 ENCOUNTER — Encounter: Payer: Self-pay | Admitting: Obstetrics & Gynecology

## 2014-09-14 ENCOUNTER — Telehealth: Payer: Self-pay

## 2014-09-14 NOTE — Telephone Encounter (Signed)
Received email to reschedule appointment. Left message for patient to call office to reschedule.

## 2014-09-14 NOTE — Telephone Encounter (Signed)
Called patient left message for patient to call our office to get rescheduled.

## 2015-07-24 IMAGING — US US OB DETAIL+14 WK
1 of 2 series · 12 of 28 positions shown · non-contrast
Comparison: none

[Series 1: us ob detail +14 wk · 12 of 98 slices shown]
[im 1/98]
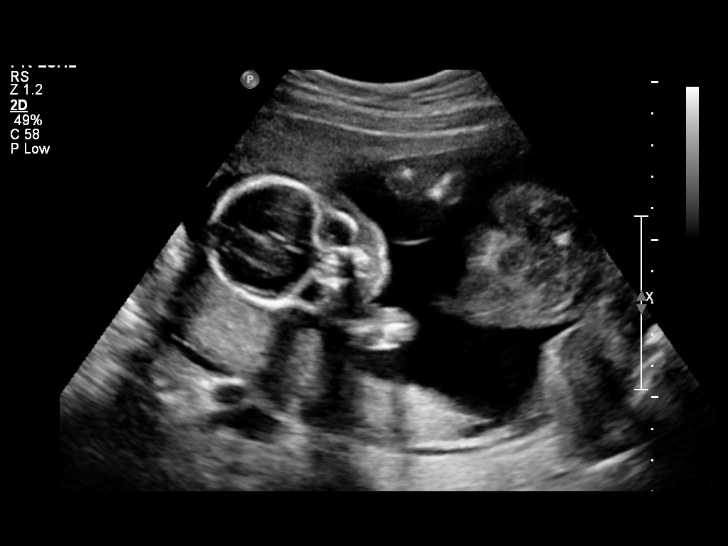
[im 8/98]
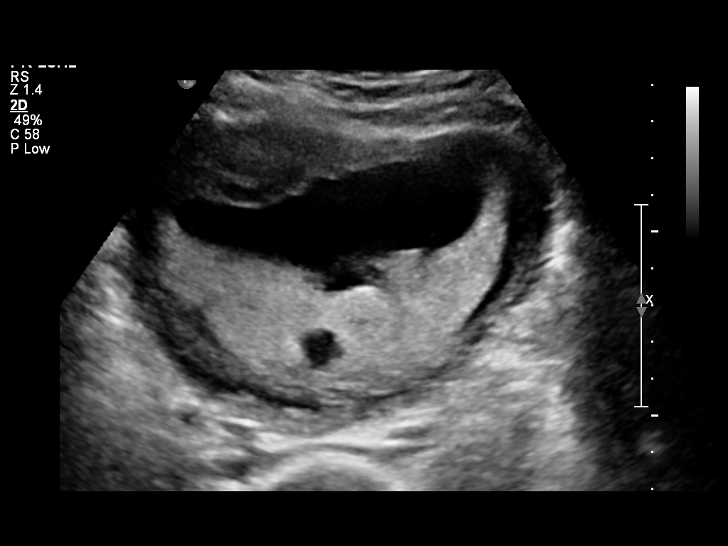
[im 15/98]
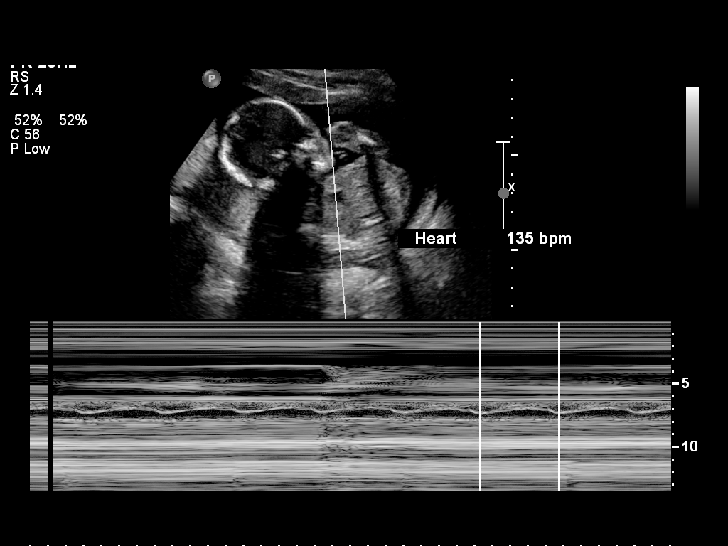
[im 27/98]
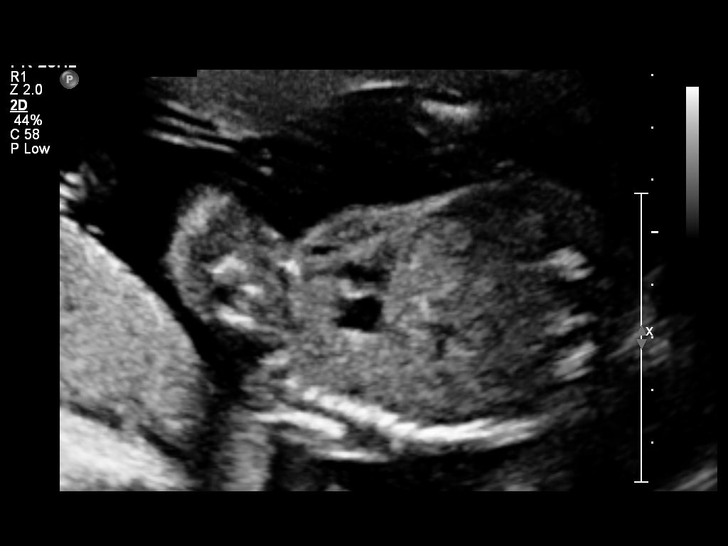
[im 34/98]
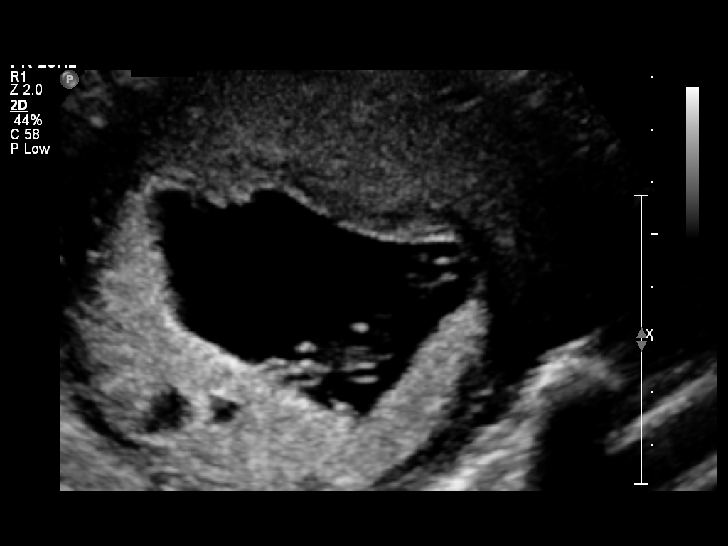
[im 42/98]
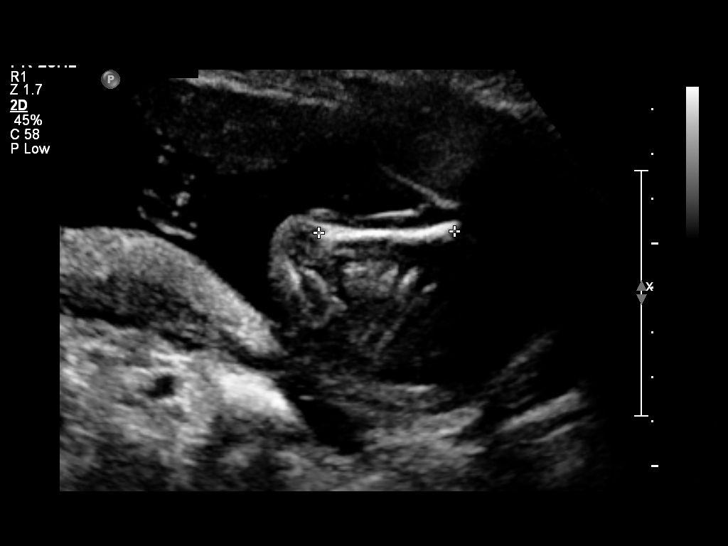
[im 53/98]
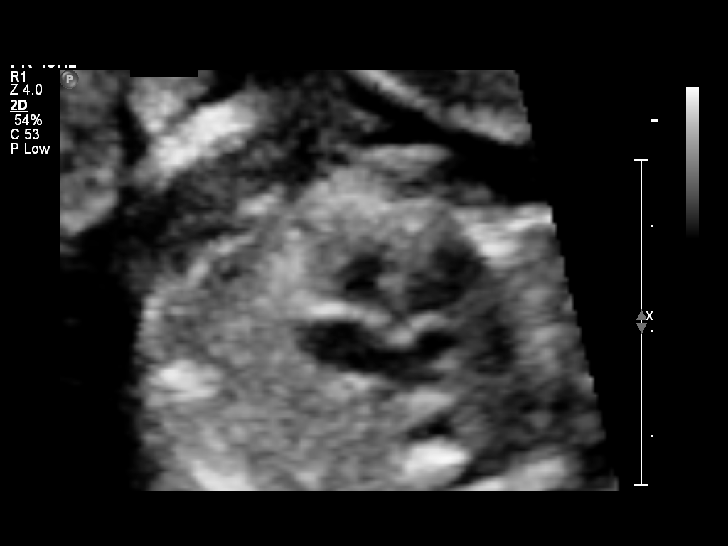
[im 60/98]
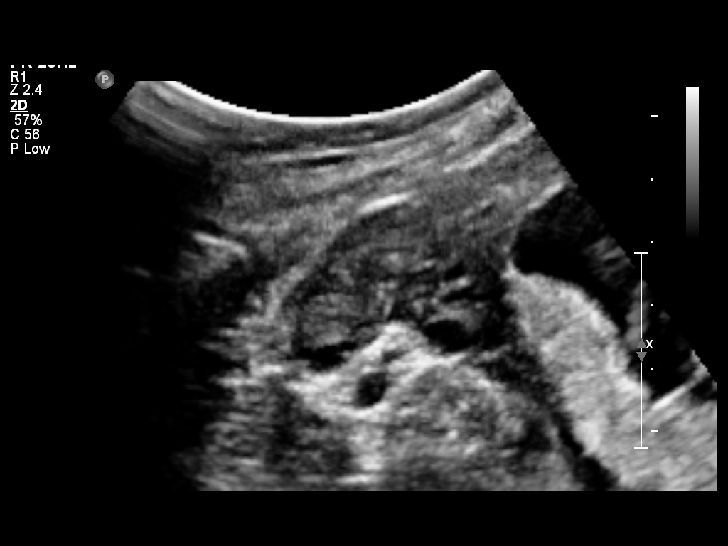
[im 68/98]
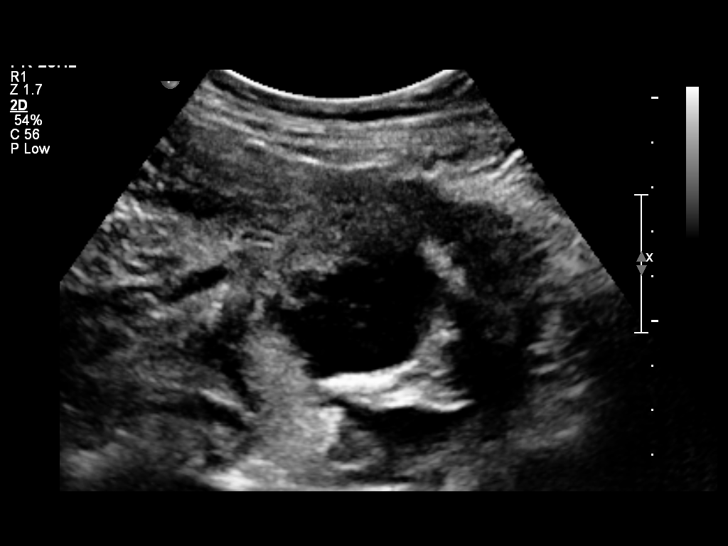
[im 79/98]
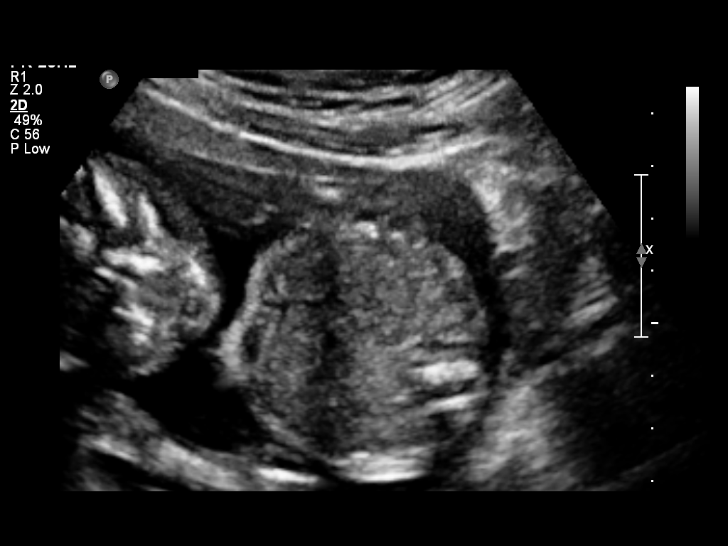
[im 86/98]
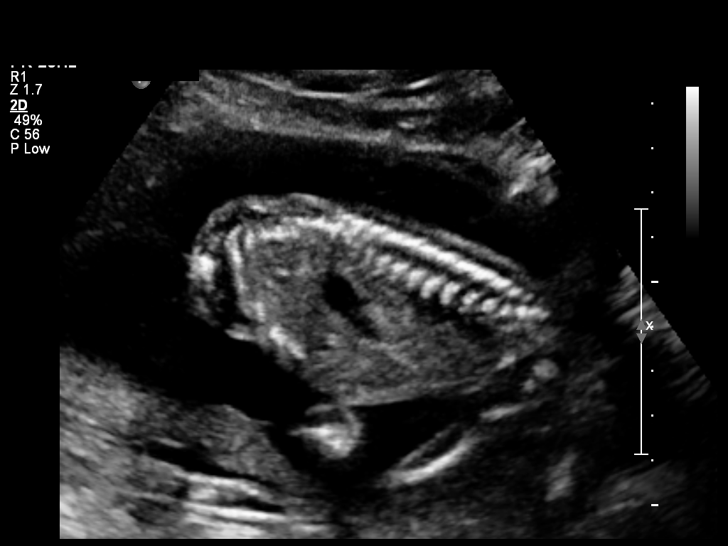
[im 94/98]
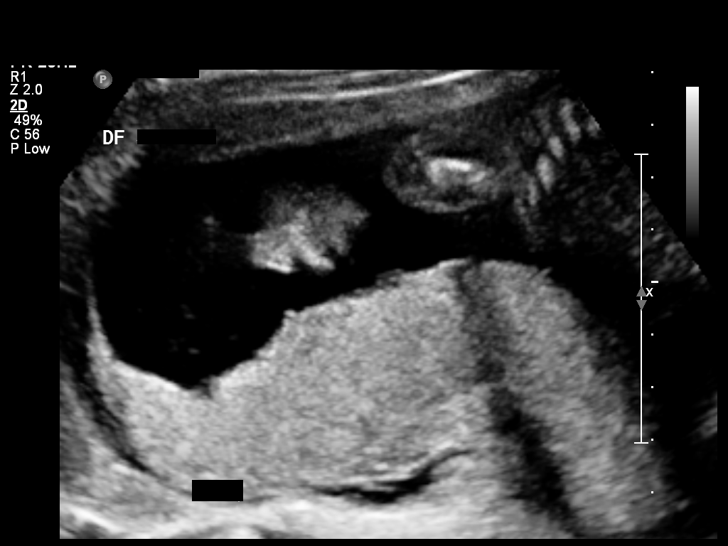

[12 of 28 positions shown; findings below may reference images not displayed]

OBSTETRICS REPORT
                      (Signed Final 12/07/2012 [DATE])

Service(s) Provided

 US OB DETAIL + 14 WK                                  76811.0
Indications

 Detailed fetal anatomic survey
Fetal Evaluation

 Num Of Fetuses:    1
 Fetal Heart Rate:  135                         bpm
 Cardiac Activity:  Observed
 Placenta:          Posterior Fundal, above
                    cervical os
 P. Cord            Visualized, central
 Insertion:

 Amniotic Fluid
 AFI FV:      Subjectively within normal limits
                                             Larg Pckt:   4.38   cm
Biometry

 BPD:     42.6  mm    G. Age:   18w 6d                CI:        65.88   70 - 86
                                                      FL/HC:      18.4   16.8 -

 HC:     168.4  mm    G. Age:   19w 4d       25  %    HC/AC:      1.12   1.09 -

 AC:     150.9  mm    G. Age:   20w 2d       60  %    FL/BPD:
 FL:        31  mm    G. Age:   19w 4d       34  %    FL/AC:      20.5   20 - 24
 HUM:     30.4  mm    G. Age:   20w 0d       58  %
 CER:     18.9  mm    G. Age:   18w 3d        9  %
 NFT:      3.2  mm

 Est. FW:     320  gm    0 lb 11 oz      50  %
Gestational Age

 U/S Today:     19w 4d                                        EDD:   04/29/13
 Best:          19w 6d    Det. By:   Previous Ultrasound      EDD:   04/27/13
2nd Trimester Genetic Sonogram - Trisomy 21 Screening

 Age:                                             19          Risk=1:   885

 Structural anomalies (inc. cardiac):             No
 Echogenic bowel:                                 No
 Hypoplastic / absent midphalanx 5th Digit:       No
 Wide space 6st-9nd toes:                         N/A
 Pyelectasis:                                     No
 2-vessel umbilical cord:                         No
 Echogenic cardiac foci:                          Yes
Anatomy

 Cranium:          Appears normal         Aortic Arch:      Not well visualized
 Fetal Cavum:      Appears normal         Ductal Arch:      Not well visualized
 Ventricles:       Appears normal         Diaphragm:        Appears normal
 Choroid Plexus:   Appears normal         Stomach:          Appears normal
 Cerebellum:       Appears normal         Abdomen:          Appears normal
 Posterior Fossa:  Appears normal         Abdominal Wall:   Appears nml (cord
                                                            insert, abd wall)
 Nuchal Fold:      Appears normal         Cord Vessels:     Appears normal (3
                                                            vessel cord)
 Face:             Appears normal         Kidneys:          Appear normal
                   (orbits and profile)
 Lips:             Appears normal         Bladder:          Appears normal
 Heart:            Echogenic focus        Spine:            Appears normal
                   in LV
 RVOT:             Appears normal         Lower             Appears normal
                                          Extremities:
 LVOT:             Appears normal         Upper             Appears normal
                                          Extremities:

 Other:  Technically difficult due to fetal position. Nasal bone visualized. Heels
         and 5th digit visualized. Female gender.
Targeted Anatomy

 Fetal Central Nervous System
 Cisterna Magna:
Cervix Uterus Adnexa

 Cervical Length:   3.5       cm

 Cervix:       Normal appearance by transabdominal scan.

 Left Ovary:   Within normal limits.
 Right Ovary:  Within normal limits.
Impression

 Single living IUP with assigned GA of 19w 6d. EGA by
 ultrasound is concordant.
 Echogenic intracardiac focus noted in the LV. This is a soft
 marker for Trisomy 21. No other soft markers are visualized.
 The visualized fetal anatomy appears normal.
 The aortic arch and ductal arch are not well visualized.
 Normal amniotic fluid volume and cervical length.

 questions or concerns.

## 2019-05-11 ENCOUNTER — Ambulatory Visit
Admission: EM | Admit: 2019-05-11 | Discharge: 2019-05-11 | Disposition: A | Discharge status: Home / Self Care | Payer: Self-pay

## 2019-05-11 ENCOUNTER — Other Ambulatory Visit: Payer: Self-pay

## 2019-05-11 ENCOUNTER — Encounter: Payer: Self-pay | Admitting: Emergency Medicine

## 2019-05-11 DIAGNOSIS — K089 Disorder of teeth and supporting structures, unspecified: Secondary | ICD-10-CM

## 2019-05-11 DIAGNOSIS — G8929 Other chronic pain: Secondary | ICD-10-CM

## 2019-05-11 NOTE — Discharge Instructions (Addendum)
Low-Cost Community Dental Resources:  [place] [facility] ([facility] Phone number: [phone]  [place] [facility] Phone number: [phone]  - [facility] Address: [address], [place], [place], [address] [phone] Ext.[phone] *Schedule: 2nd and 4thThursday of the month at 6:30a.m.(simple extraction only - no wisdom teeth or surgery) *First come/First serve bases - First 10 clients served  - [facility] ([place], and [place] residents only) Address: [address], [place], [place], [address] [phone]  - [facility] Address: [address], [place], [place], [address] Phone: [phone]  [place] [facility] Address: [address], [place], [place], [address] Phone: [phone]  - [facility] Address: [address], [place], [place], [address] Phone: [phone]  [place] [facility] Address: [address], [place], [place] [address] Phone: [phone] 

## 2019-05-11 NOTE — ED Triage Notes (Signed)
Pt presents to [facility] for assessment of left upper dental pain x 3 weeks.  States she was seen by a dentist and started on 3 weeks of antibiotics, which she has not finished yet.  States she woke up this morning in severe pain, states she has been having episodes of neck and facial swelling with it.  States she is currently pregnant and had not been able to get in for prenatal care yet.  Because of being pregnant she was given Tylenol #3, which she states is not helping with th epain.

## 2019-05-11 NOTE — ED Provider Notes (Signed)
[facility]    CSN: [id number] Arrival date & time: 05/11/19  0850      History   Chief Complaint Chief Complaint  Patient presents with  . Dental Pain    HPI Michele Perkins is a 24 y.o. female presenting for evaluation of left-sided neck versus dental versus ear pain.  Patient provides history: States she was seen by dentist over 3 weeks ago and instructed to start amoxicillin for dental infection.  States that dentist told her she will need a root canal, though was requiring $800 prior to scheduling appointment which patient did not have.  Patient states that she has been taking amoxicillin for the last 3 weeks without change in pain.  Patient states she was given Tylenol 3 which helps some with her pain.  Not currently using hot or cold compresses.  No fever, arthralgias, myalgias, difficulty swallowing, drooling, change in hearing, difficulty breathing, chest pain.   Past Medical History:  Diagnosis Date  . ADHD (attention deficit hyperactivity disorder)   . Gallbladder colic     Patient Active Problem List   Diagnosis Date Noted  . Disorder of function of stomach 05/03/2014  . Family history of bipolar disorder 04/25/2014  . Major depressive disorder, recurrent severe without psychotic features ([place]) 04/25/2014  . Changeable mood 04/25/2014  . Pregnancy 04/26/2013  . Smoker 11/16/2012    History reviewed. No pertinent surgical history.  OB History    Gravida  2   Para  1   Term  1   Preterm      AB      Living  1     SAB      TAB      Ectopic      Multiple      Live Births  1            Home Medications    Prior to Admission medications   Medication Sig Start Date End Date Taking? Authorizing Provider  ibuprofen (ADVIL,MOTRIN) 600 MG tablet Take 1 tablet (600 mg total) by mouth every 6 (six) hours. 04/28/13   Kassie Mends, MD  sertraline (ZOLOFT) 25 MG tablet Take 1 tablet (25 mg total) by mouth daily. 05/03/14   Ruben Im, PA-C    Family History Family History  Problem Relation Age of Onset  . Anxiety disorder Mother   . Bipolar disorder Mother   . Depression Mother   . Drug abuse Mother   . Alcohol abuse Father   . Anxiety disorder Father   . OCD Father   . Heart attack Maternal Grandfather        great gf  . Dementia Paternal Grandmother   . Depression Paternal Grandmother     Social History Social History   Tobacco Use  . Smoking status: Current Some Day Smoker    Packs/day: 0.50    Years: 7.00    Pack years: 3.50    Types: Cigarettes    Last attempt to quit: 03/26/2012    Years since quitting: 7.1  . Smokeless tobacco: Never Used  Substance Use Topics  . Alcohol use: Yes    Alcohol/week: 1.0 standard drinks    Types: 1 Standard drinks or equivalent per week  . Drug use: Yes    Types: Marijuana, Cocaine, MDMA (Ecstacy)    Comment: marijuana within the last year     Allergies   Patient has no known allergies.   Review of Systems As per HPI  Physical Exam Triage Vital Signs ED Triage Vitals [05/11/19 0902]  Enc Vitals Group     BP 131/76     Pulse Rate 66     Resp 16     Temp 98.1 F (36.7 C)     Temp Source Temporal     SpO2 99 %     Weight      Height      Head Circumference      Peak Flow      Pain Score 10     Pain Loc      Pain Edu?      Excl. in GC?    No data found.  Updated Vital Signs BP 131/76 (BP Location: Left Arm)   Pulse 66   Temp 98.1 F (36.7 C) (Temporal)   Resp 16   LMP 02/06/2019   SpO2 99%   Visual Acuity Right Eye Distance:   Left Eye Distance:   Bilateral Distance:    Right Eye Near:   Left Eye Near:    Bilateral Near:     Physical Exam Constitutional:      General: She is not in acute distress.    Appearance: She is not ill-appearing.  HENT:     Head: Normocephalic and atraumatic.     Right Ear: Tympanic membrane, ear canal and external ear normal.     Left Ear: Tympanic membrane, ear canal and external ear  normal.     Ears:     Comments: Negative tragal tenderness bilaterally    Nose: Nose normal.     Comments: Negative sinus tenderness bilaterally    Mouth/Throat:     Mouth: Mucous membranes are moist.     Pharynx: Oropharynx is clear. No oropharyngeal exudate or posterior oropharyngeal erythema.  Eyes:     General: No scleral icterus.    Extraocular Movements: Extraocular movements intact.     Conjunctiva/sclera: Conjunctivae normal.     Pupils: Pupils are equal, round, and reactive to light.  Neck:     Comments: Decreased neck extension secondary pain.  Patient reports exquisite tenderness throughout right side of neck, though this is variable/distractible throughout exam Cardiovascular:     Rate and Rhythm: Normal rate and regular rhythm.     Heart sounds: No murmur. No gallop.   Pulmonary:     Effort: Pulmonary effort is normal. No respiratory distress.     Breath sounds: No wheezing.  Musculoskeletal:     Cervical back: Neck supple. Tenderness present.  Lymphadenopathy:     Cervical: No cervical adenopathy.  Skin:    Capillary Refill: Capillary refill takes less than 2 seconds.     Coloration: Skin is not jaundiced or pale.  Neurological:     General: No focal deficit present.     Mental Status: She is alert and oriented to person, place, and time.      UC Treatments / Results  Labs (all labs ordered are listed, but only abnormal results are displayed) Labs Reviewed - No data to display  EKG   Radiology No results found.  Procedures Procedures (including critical care time)  Medications Ordered in UC Medications - No data to display  Initial Impression / Assessment and Plan / UC Course  I have reviewed the triage vital signs and the nursing notes.  Pertinent labs & imaging results that were available during my care of the patient were reviewed by me and considered in my medical decision making (see chart for details).  I have reviewed the triage vital  signs and the nursing notes.  All pertinent labs & imaging results that were available during my care of the patient were reviewed by me and considered in my medical decision making (see chart for details).  Patient afebrile, nontoxic in office today.  Exam is benign.  Reviewed patient has already reportedly been on antibiotics for a long time and has been told she needs further dental intervention.  Encourage patient to use hot and cold compresses in conjunction with Tylenol in the interim.  Provided low cost community dental resources.  Patient verbalized appreciation for time today stating "just want to make sure nothing looks bad ".  Return precautions discussed, patient verbalized understanding and is agreeable to plan. Final Clinical Impressions(s) / UC Diagnoses   Final diagnoses:  Chronic dental pain     Discharge Instructions     Low-Cost Community Dental Resources:  [facility] ([facility]) Phone number: [phone]  [facility] Phone number: [phone]  - [facility] Address: [address] [place], [place], [address] Phone:[phone] Ext.[phone] *Schedule: 2nd and 4thThursday of the month at 6:30a.m.(simple extraction only - no wisdom teeth or surgery) *First come/First serve bases - First 10 clients served  - [facility] [place], [place], and [employer] residents only) Address: [address] [place], [place], [place], [address] Phone:[phone]  - [facility] Address: [address] [place], [place], [place], [address] Phone: [phone]  [facility] Address: [address], [place], [place], [address] Phone: [phone]  - [facility] Address: [address], [place], [place], [address] Phone: [phone]  [facility] Address: [address], [place], [place] [address] Phone:  [phone]    ED Prescriptions    None     I have reviewed the PDMP during this encounter.   [place], [place], [place] 05/11/19 1104
